# Patient Record
Sex: Female | Born: 1960
Health system: Southern US, Community
[De-identification: ages and names within clinical notes are randomized; demographics above are authoritative.]

## PROBLEM LIST (undated history)

## (undated) DIAGNOSIS — J45909 Unspecified asthma, uncomplicated: Secondary | ICD-10-CM

## (undated) DIAGNOSIS — T7840XA Allergy, unspecified, initial encounter: Secondary | ICD-10-CM

## (undated) DIAGNOSIS — E559 Vitamin D deficiency, unspecified: Secondary | ICD-10-CM

## (undated) DIAGNOSIS — K5790 Diverticulosis of intestine, part unspecified, without perforation or abscess without bleeding: Secondary | ICD-10-CM

## (undated) DIAGNOSIS — K219 Gastro-esophageal reflux disease without esophagitis: Secondary | ICD-10-CM

## (undated) DIAGNOSIS — F419 Anxiety disorder, unspecified: Secondary | ICD-10-CM

## (undated) DIAGNOSIS — E785 Hyperlipidemia, unspecified: Secondary | ICD-10-CM

## (undated) DIAGNOSIS — E119 Type 2 diabetes mellitus without complications: Secondary | ICD-10-CM

## (undated) HISTORY — DX: Anxiety disorder, unspecified: F41.9

## (undated) HISTORY — DX: Type 2 diabetes mellitus without complications: E11.9

## (undated) HISTORY — DX: Unspecified asthma, uncomplicated: J45.909

## (undated) HISTORY — DX: Diverticulosis of intestine, part unspecified, without perforation or abscess without bleeding: K57.90

## (undated) HISTORY — DX: Gastro-esophageal reflux disease without esophagitis: K21.9

## (undated) HISTORY — DX: Hyperlipidemia, unspecified: E78.5

## (undated) HISTORY — DX: Allergy, unspecified, initial encounter: T78.40XA

## (undated) HISTORY — DX: Vitamin D deficiency, unspecified: E55.9

---

## 2001-05-27 ENCOUNTER — Encounter: Payer: Self-pay | Admitting: Family Medicine

## 2001-05-27 ENCOUNTER — Encounter: Admission: RE | Admit: 2001-05-27 | Discharge: 2001-05-27 | Payer: Self-pay | Admitting: Family Medicine

## 2001-09-17 ENCOUNTER — Encounter: Payer: Self-pay | Admitting: Internal Medicine

## 2001-09-17 ENCOUNTER — Encounter: Payer: Self-pay | Admitting: *Deleted

## 2001-09-17 ENCOUNTER — Inpatient Hospital Stay (HOSPITAL_COMMUNITY): Admission: EM | Admit: 2001-09-17 | Discharge: 2001-09-18 | Payer: Self-pay | Admitting: Emergency Medicine

## 2001-09-18 ENCOUNTER — Encounter: Payer: Self-pay | Admitting: Cardiology

## 2002-06-25 ENCOUNTER — Encounter: Payer: Self-pay | Admitting: Family Medicine

## 2002-06-25 ENCOUNTER — Encounter: Admission: RE | Admit: 2002-06-25 | Discharge: 2002-06-25 | Payer: Self-pay | Admitting: Family Medicine

## 2003-08-24 ENCOUNTER — Emergency Department (HOSPITAL_COMMUNITY): Admission: EM | Admit: 2003-08-24 | Discharge: 2003-08-24 | Payer: Self-pay | Admitting: Family Medicine

## 2003-08-31 ENCOUNTER — Ambulatory Visit (HOSPITAL_COMMUNITY): Admission: RE | Admit: 2003-08-31 | Discharge: 2003-08-31 | Payer: Self-pay | Admitting: Family Medicine

## 2004-09-06 ENCOUNTER — Ambulatory Visit (HOSPITAL_COMMUNITY): Admission: RE | Admit: 2004-09-06 | Discharge: 2004-09-06 | Payer: Self-pay | Admitting: Family Medicine

## 2004-09-14 ENCOUNTER — Encounter (INDEPENDENT_AMBULATORY_CARE_PROVIDER_SITE_OTHER): Payer: Self-pay | Admitting: Specialist

## 2004-09-14 ENCOUNTER — Encounter: Admission: RE | Admit: 2004-09-14 | Discharge: 2004-09-14 | Payer: Self-pay | Admitting: Family Medicine

## 2004-11-20 ENCOUNTER — Emergency Department (HOSPITAL_COMMUNITY): Admission: EM | Admit: 2004-11-20 | Discharge: 2004-11-20 | Payer: Self-pay | Admitting: Family Medicine

## 2005-01-18 ENCOUNTER — Encounter: Admission: RE | Admit: 2005-01-18 | Discharge: 2005-01-18 | Payer: Self-pay | Admitting: Family Medicine

## 2005-05-17 ENCOUNTER — Encounter: Admission: RE | Admit: 2005-05-17 | Discharge: 2005-05-17 | Payer: Self-pay | Admitting: Family Medicine

## 2005-06-02 ENCOUNTER — Encounter: Admission: RE | Admit: 2005-06-02 | Discharge: 2005-06-02 | Payer: Self-pay | Admitting: Family Medicine

## 2005-09-15 ENCOUNTER — Ambulatory Visit: Payer: Self-pay | Admitting: Family Medicine

## 2005-09-15 ENCOUNTER — Encounter: Admission: RE | Admit: 2005-09-15 | Discharge: 2005-09-15 | Payer: Self-pay | Admitting: Family Medicine

## 2005-10-02 ENCOUNTER — Encounter: Admission: RE | Admit: 2005-10-02 | Discharge: 2005-10-02 | Payer: Self-pay | Admitting: Family Medicine

## 2006-11-28 ENCOUNTER — Ambulatory Visit (HOSPITAL_COMMUNITY): Admission: RE | Admit: 2006-11-28 | Discharge: 2006-11-28 | Payer: Self-pay | Admitting: Family Medicine

## 2007-03-26 IMAGING — CR DG KNEE 1-2V*R*
2 series · 2 of 2 positions shown · non-contrast
Comparison: none

CLINICAL DATA: 44-year-old with right medial knee pain.  No injury.
 2-VIEW RIGHT KNEE:

[view not recorded (1 of 2)]
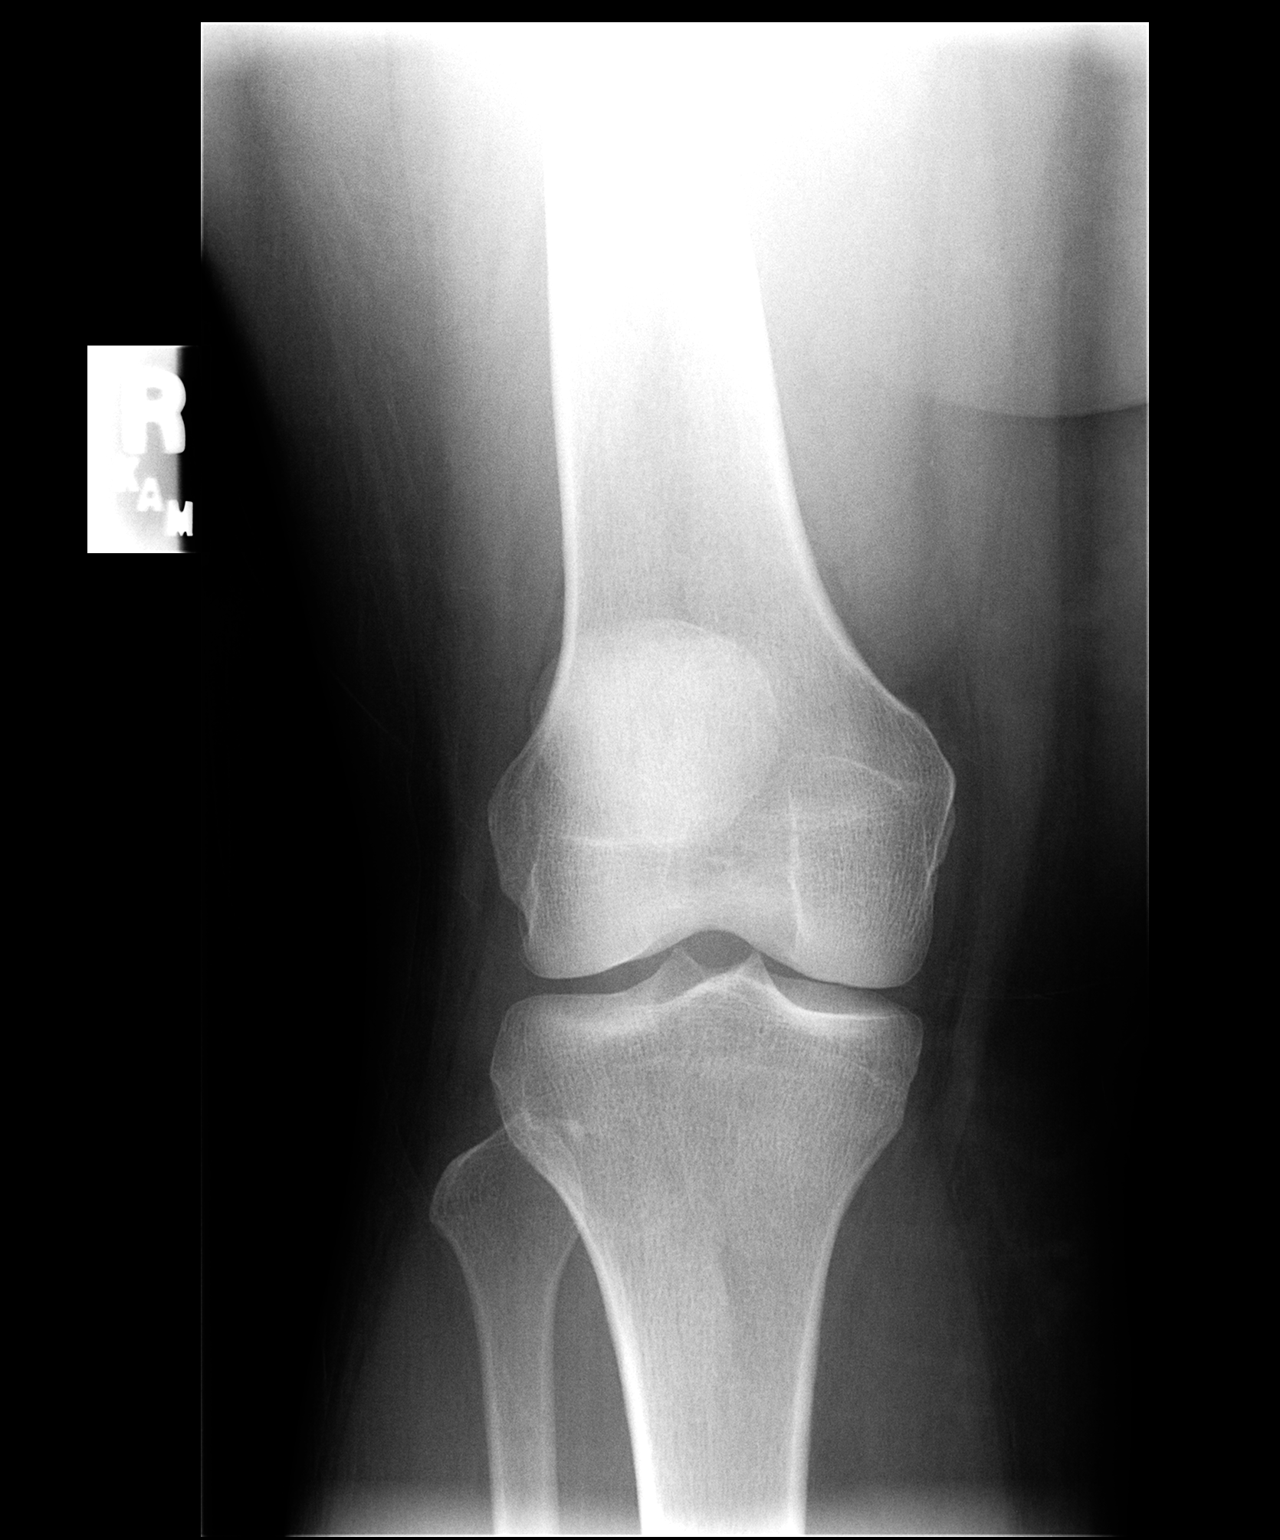

[view not recorded (2 of 2)]
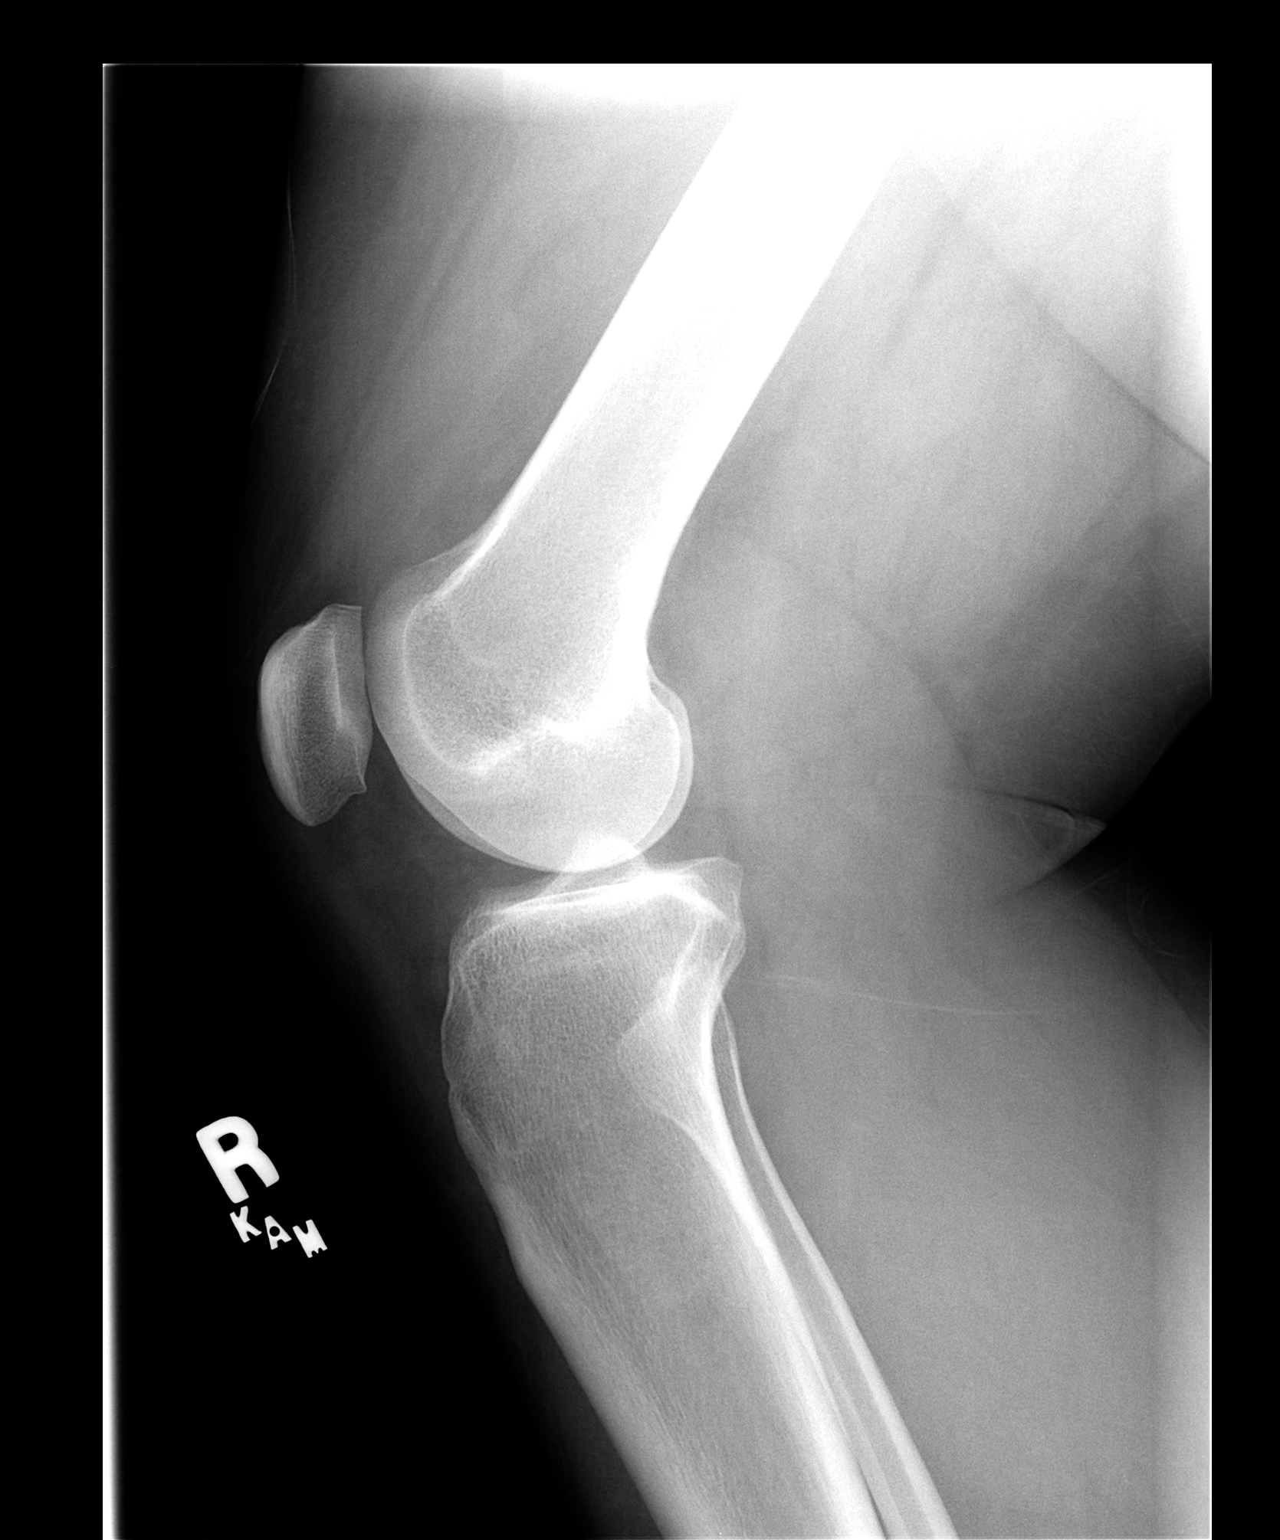

[2 of 2 positions shown; findings below may reference images not displayed]

FINDINGS: Joint spaces are maintained.  No fractures are seen.  No degenerative changes.  There is a small joint effusion.
IMPRESSION: No acute bony findings.  Probable small joint effusion.

## 2007-11-21 IMAGING — CR DG CERVICAL SPINE COMPLETE 4+V
6 series · 6 of 6 positions shown · non-contrast
Comparison: none

CLINICAL DATA: Headaches radiating into the neck.
 CERVICAL SPINE:
 Five views of the cervical spine were obtained.  The cervical vertebrae are slightly straightened in alignment.  There is degenerative disk disease at C5-6 where there is slight loss of disk space.  Only mild foraminal narrowing is noted bilaterally at C5-6.  The odontoid process is intact.

[view not recorded (1 of 6)]
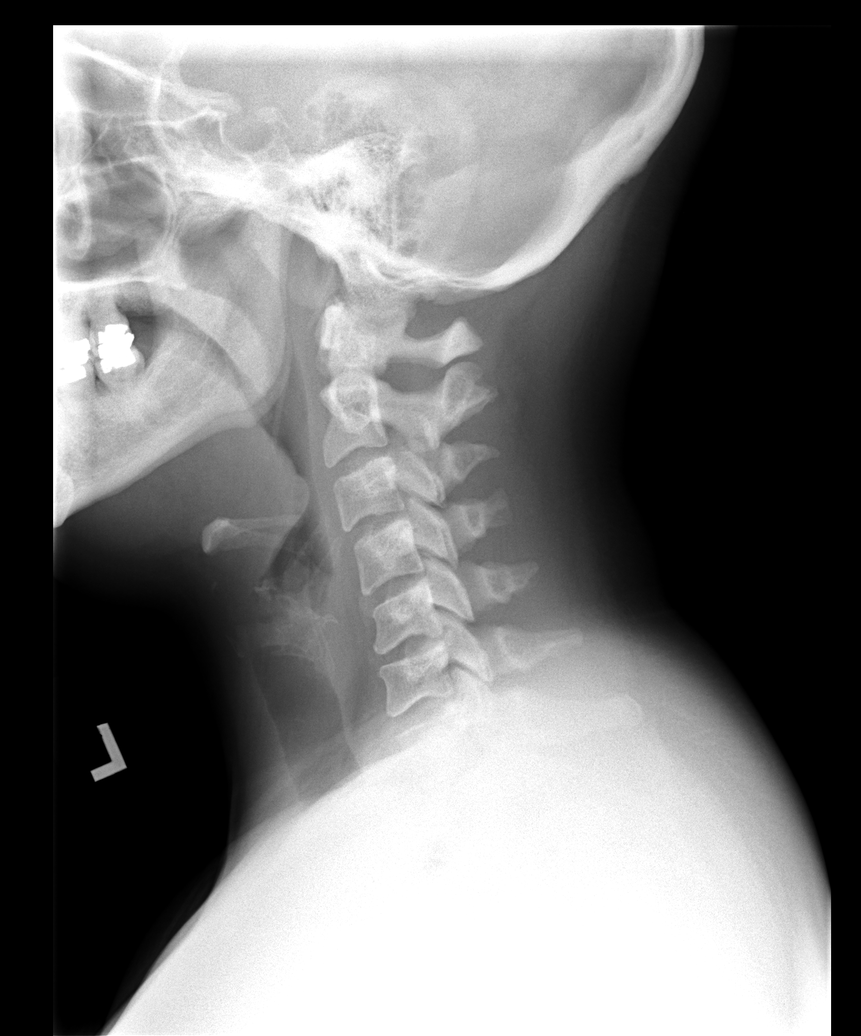

[view not recorded (2 of 6)]
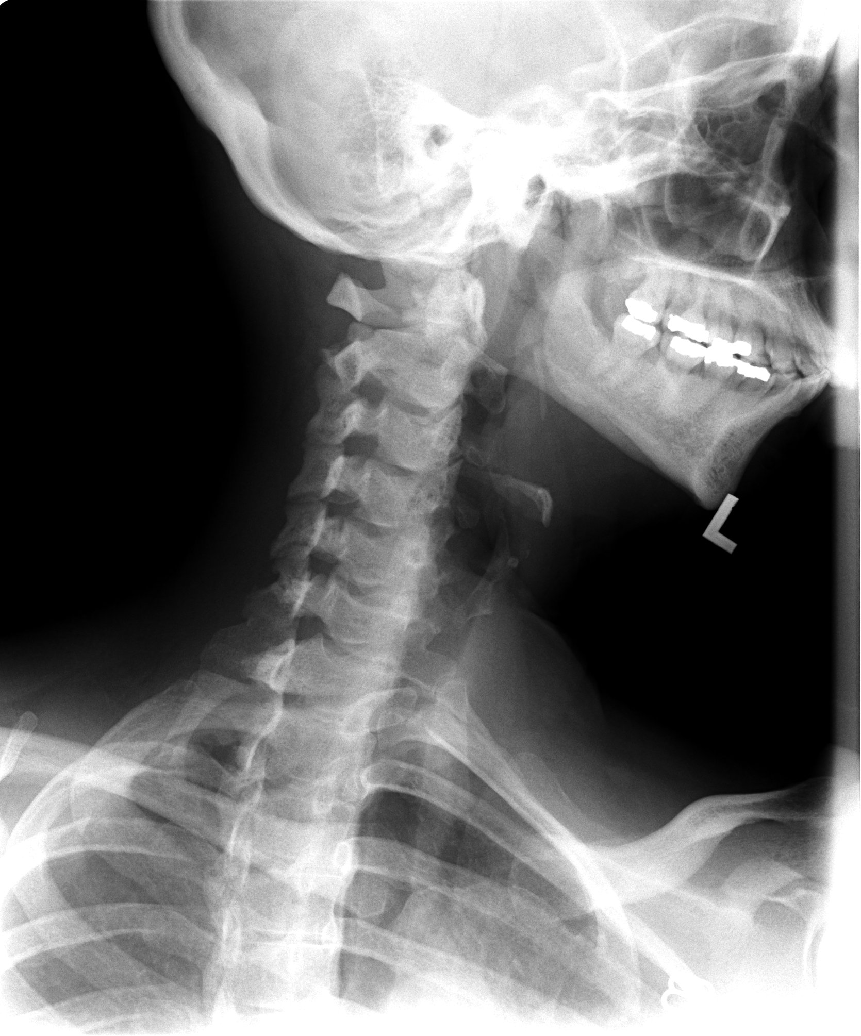

[view not recorded (3 of 6)]
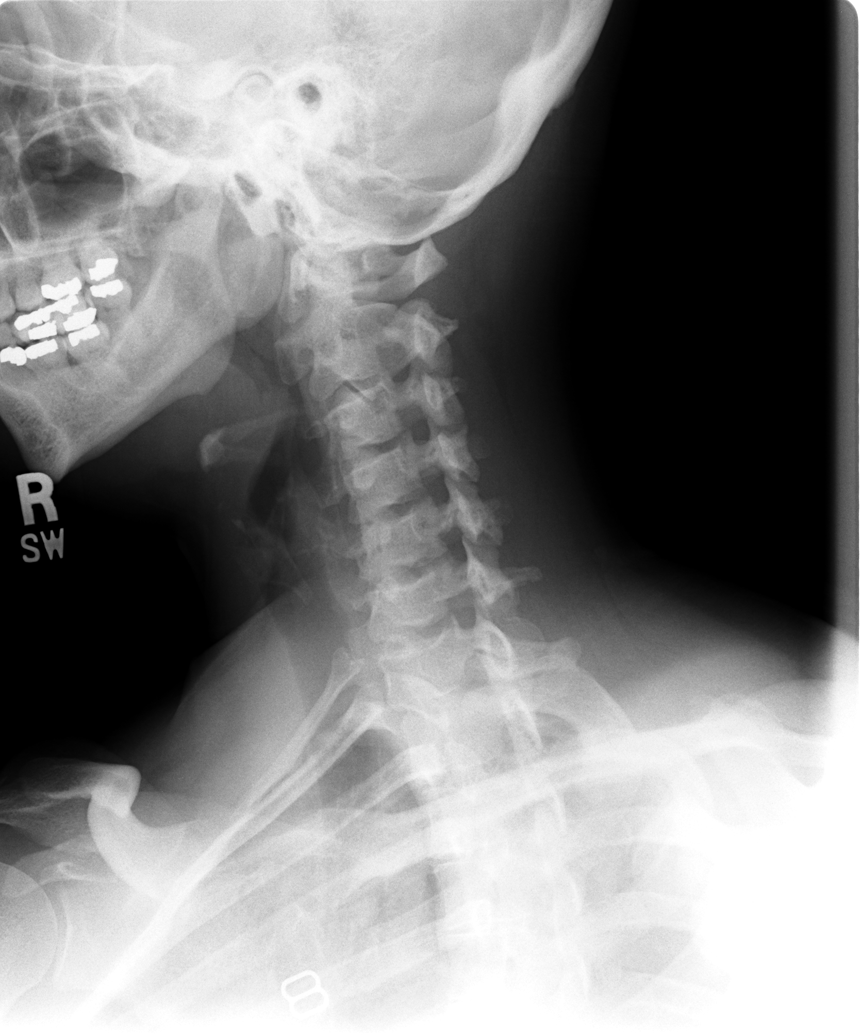

[view not recorded (4 of 6)]
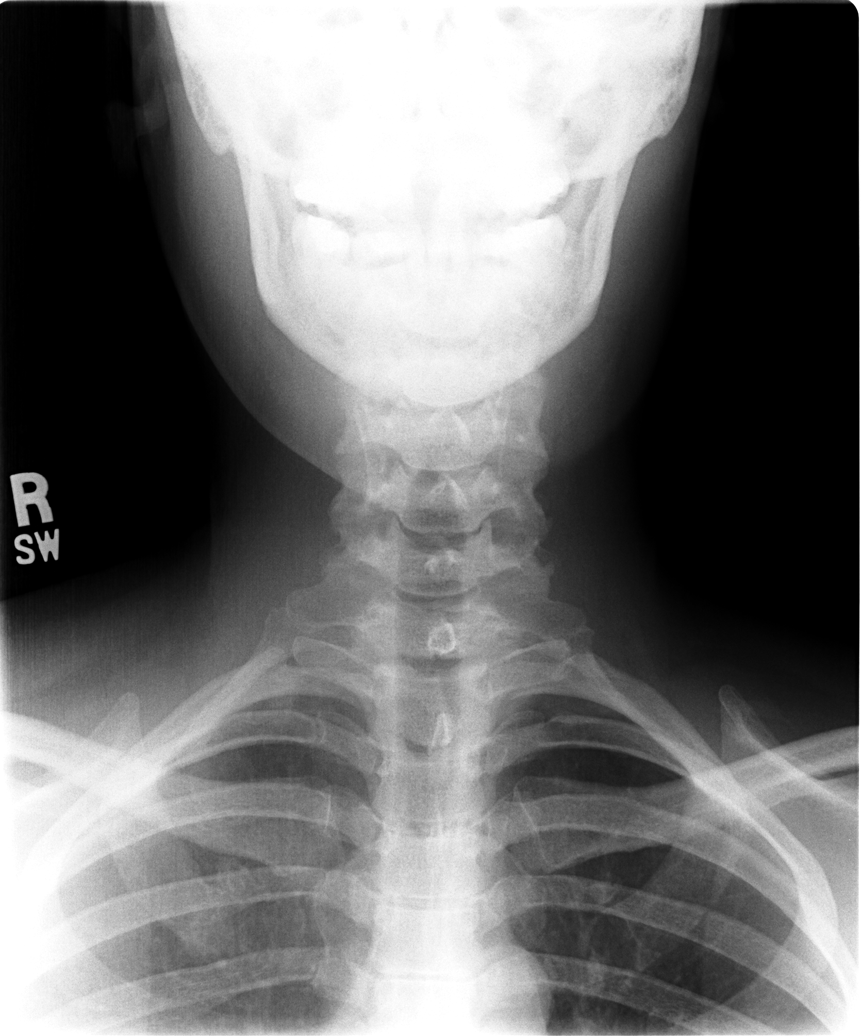

[view not recorded (5 of 6)]
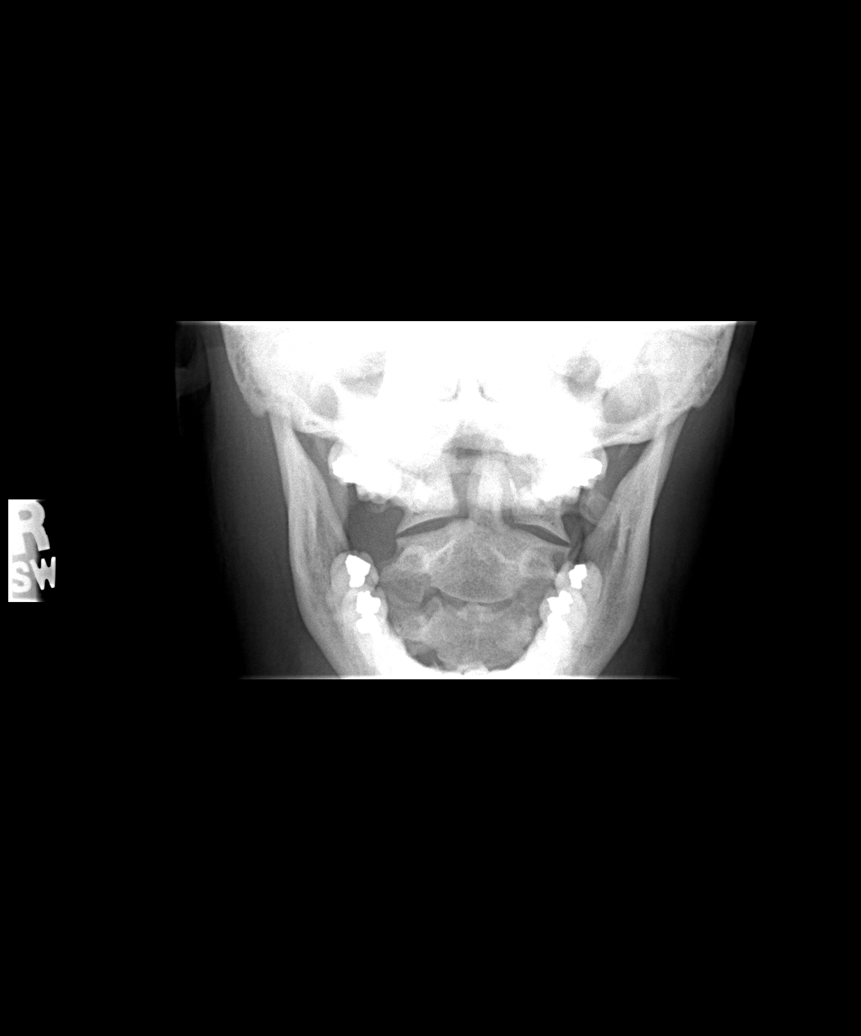

[view not recorded (6 of 6)]
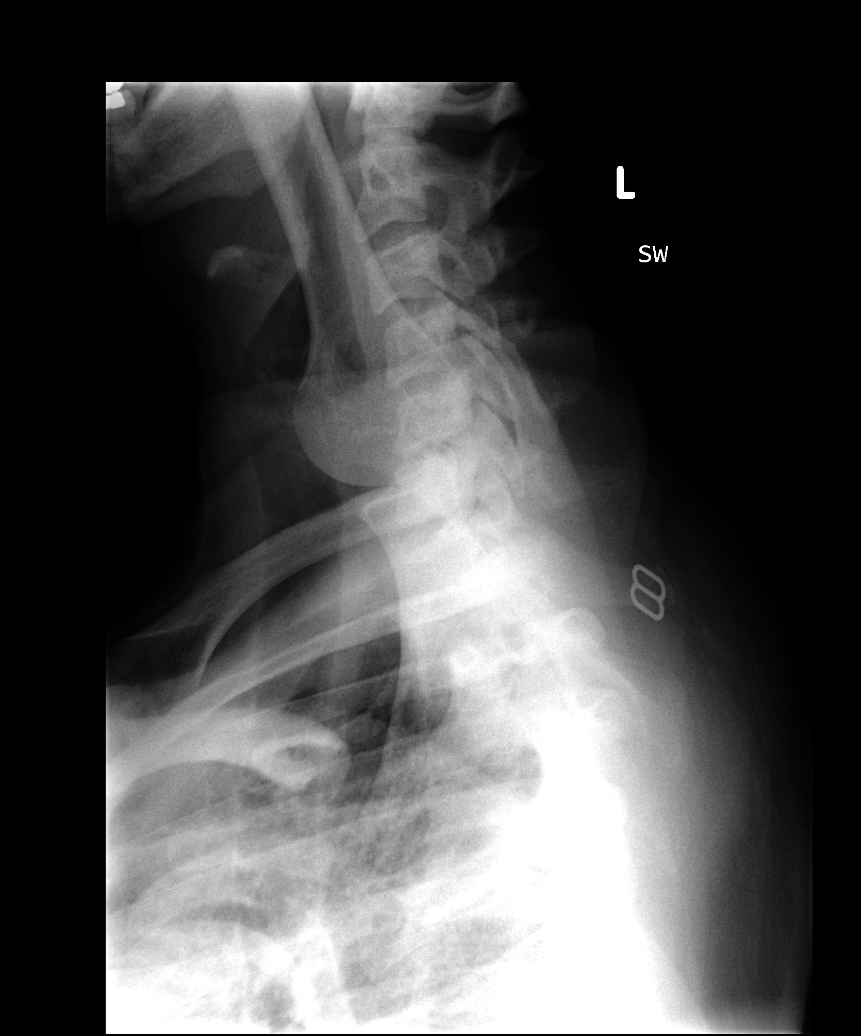

[6 of 6 positions shown; findings below may reference images not displayed]

IMPRESSION: Mild degenerative disk disease at C5-6 with mild foraminal narrowing at that level.  Straightened alignment.

## 2007-12-19 ENCOUNTER — Ambulatory Visit (HOSPITAL_COMMUNITY): Admission: RE | Admit: 2007-12-19 | Discharge: 2007-12-19 | Payer: Self-pay | Admitting: Family Medicine

## 2008-02-12 ENCOUNTER — Encounter: Admission: RE | Admit: 2008-02-12 | Discharge: 2008-02-12 | Payer: Self-pay | Admitting: Family Medicine

## 2009-01-05 ENCOUNTER — Ambulatory Visit (HOSPITAL_COMMUNITY): Admission: RE | Admit: 2009-01-05 | Discharge: 2009-01-05 | Payer: Self-pay | Admitting: Family Medicine

## 2009-05-21 ENCOUNTER — Emergency Department (HOSPITAL_COMMUNITY): Admission: EM | Admit: 2009-05-21 | Discharge: 2009-05-22 | Payer: Self-pay | Admitting: Emergency Medicine

## 2010-01-21 ENCOUNTER — Ambulatory Visit (HOSPITAL_COMMUNITY)
Admission: RE | Admit: 2010-01-21 | Discharge: 2010-01-21 | Payer: Self-pay | Source: Home / Self Care | Admitting: Family Medicine

## 2010-02-10 ENCOUNTER — Encounter
Admission: RE | Admit: 2010-02-10 | Discharge: 2010-02-10 | Payer: Self-pay | Source: Home / Self Care | Attending: Family Medicine | Admitting: Family Medicine

## 2010-03-12 ENCOUNTER — Encounter: Payer: Self-pay | Admitting: Family Medicine

## 2010-03-13 ENCOUNTER — Encounter: Payer: Self-pay | Admitting: Family Medicine

## 2010-05-11 LAB — POCT I-STAT, CHEM 8
BUN: 12 mg/dL (ref 6–23)
Calcium, Ion: 1.2 mmol/L (ref 1.12–1.32)
HCT: 38 % (ref 36.0–46.0)
Sodium: 140 mEq/L (ref 135–145)
TCO2: 29 mmol/L (ref 0–100)

## 2010-05-11 LAB — URINALYSIS, ROUTINE W REFLEX MICROSCOPIC
Glucose, UA: NEGATIVE mg/dL
Ketones, ur: 15 mg/dL — AB
Protein, ur: 30 mg/dL — AB
pH: 5.5 (ref 5.0–8.0)

## 2010-05-11 LAB — LIPASE, BLOOD: Lipase: 20 U/L (ref 11–59)

## 2010-05-11 LAB — HEPATIC FUNCTION PANEL
Albumin: 3.8 g/dL (ref 3.5–5.2)
Alkaline Phosphatase: 61 U/L (ref 39–117)
Total Bilirubin: 0.8 mg/dL (ref 0.3–1.2)
Total Protein: 7.4 g/dL (ref 6.0–8.3)

## 2010-05-11 LAB — URINE MICROSCOPIC-ADD ON

## 2010-07-08 NOTE — Discharge Summary (Signed)
Deborah Fuller, Deborah Fuller                     ACCOUNT NO.:  [id number]   MEDICAL RECORD NO.:  [id number]                   PATIENT TYPE:  INP   LOCATION:  2036                                 FACILITY:  [facility]   PHYSICIAN:  Beth A. Sheppard Penton, M.D.                  DATE OF BIRTH:  February 27, 1960   DATE OF ADMISSION:  09/17/2001  DATE OF DISCHARGE:  09/18/2001                                 DISCHARGE SUMMARY   DISCHARGE DIAGNOSES:  1. Atypical chest pain.     a. Suspect costochondritis versus acid reflux.     b. CT scan negative for deep venous thrombosis or pulmonary embolus.     c. Cardiolite:  No ischemia, normal ejection fraction.  2. Exertional dyspnea.     a. Likely secondary deconditioning.  3. History of palpitations.     a. Consider Holter monitor or event monitor if persistent.  4. Mild normocytic anemia.     a. Discharge hemoglobin 11.6, MCV 89.     b. Outpatient workup if needed.  5. Status post cesarean section in 1994.  6. No known drug allergies.   DISCHARGE MEDICATIONS:  1. (New) Protonix 40 mg q.d. x1 month.  2. (New) Vioxx 25 mg q.d. for five days.  3. Tylenol Extra-Strength one to two q.6h. p.r.n. breakthrough pain.   CONDITION UPON DISCHARGE:  Stable.  The patient is pain-free.   DISPOSITION:  Home.   RECOMMENDED ACTIVITY:  As tolerated.   RECOMMENDED DIET:  As tolerated.   SPECIAL INSTRUCTIONS:  Call or return if you have problems.   FOLLOWUP:  The patient is to contact Dr. Caryn Bee C. [place] to schedule  followup in one to two weeks.  At that time, he will reassess her atypical  chest symptoms and also consider guaiac cards for her anemia.   CONSULTANTS:  Francisca December, M.D.   PROCEDURES:  1. Spiral chest CT with lower extremity cuts.  Preliminary results:  No     evidence of deep venous thrombosis or pulmonary embolus.  2. Electrocardiogram:  Sinus bradycardia at 56, otherwise, unremarkable.  3. Exercise Cardiolite:  Ejection fraction normal.  No  evidence of ischemia     or infarct.   HOSPITAL COURSE:  1. ATYPICAL CHEST PAIN:  The patient is a 50 year old African American     nonsmoker who presents with a three-day history of anterior chest pain     and exertional dyspnea.  She describes it as a pressure at times and     occasionally as a sharp type of pleuritic pain; she also noted     palpitations at times.  She said she was seen as an outpatient recently     and diagnosed with shingles.  On presentation, she has a normal EKG     except for sinus bradycardia at a rate of 56.  Initial cardiac enzymes     were negative.  She does have  some palpable tenderness along the left     costochondral junction.  As I stated above, spiral CT was negative for PE     and DVT and exercise Cardiolite was also normal.  I suspect that this is     either a costochondritis versus a mild form of pleurisy versus     gastroesophageal reflux disease.  I would treat her with Protonix for a     month and try Vioxx for five days.  She will follow up with Dr. Sydnee Levans.     Concerning the palpitations, if they are persistent, I would recommend an     outpatient event or Holter monitor; this can be discussed as an     outpatient.   1. NORMOCYTIC ANEMIA:  Hemoglobin 11.6 with an MCV of 89.  Recommend guaiac     cards and outpatient followup per Dr. Sydnee Levans.   DISCHARGE AND OTHER PERTINENT LABORATORY DATA:  Hemoglobin 11.6, WBC 4500,  platelet count 264,000.  Sodium 137, potassium 3.6, chloride 109, bicarb 24,  BUN 10, creatinine 0.8, glucose 102.  LFTs normal.                                                Beth A. Sheppard Penton, M.D.    BAW/MEDQ  D:  09/18/2001  T:  09/24/2001  Job:  [id number]   cc:   Lilyan Punt. Sydnee Levans, M.D.   Francisca December, M.D.

## 2010-07-08 NOTE — Assessment & Plan Note (Signed)
[facility]                          [place] OFFICE NOTE   IMAJEAN, MCDERMID                     MRN:          [id number]  DATE:09/15/2005                            DOB:          17-Jan-1961    REASON FOR VISIT:  Headaches.   Ms. Pinho is a 51 year old female who complains of headaches off and on  for several months.  She describes it as a pressure tightness around her  scalp.  Sometimes it radiates down her neck.  She has been taking over-the-  counter Aleve 3-4 times a week to help with the headaches.  She denies any  photophobia, sound sensitivity, nausea or vomiting.  Headaches do not wake  her up from sleep.  She denies any other neurologic symptoms.  The headaches  are not worsening, but they are not improving.  She has noticed correlation  with increased stress.   PAST MEDICAL HISTORY:  None.   MEDICATIONS:  None.   ALLERGIES:  No known drug allergies.   FATHER HISTORY:  Father alive with a history of diabetes.  Mother is also  alive with a history of diabetes.  She has one brother who is doing well.   SOCIAL HISTORY:  She is a Architectural technologist.  Married with three children.  Drinks alcohol occasionally.   REVIEW OF SYSTEMS:  As per HPI, otherwise unremarkable.   OBJECTIVE:  VITAL SIGNS:  Blood pressure 120/86, pulse 76.  Weight 208  pounds.  GENERAL:  We have a pleasant female in no acute distress.  Answers questions  appropriately.  Alert and oriented x3.  HEENT:  Normocephalic and atraumatic.  Pupils are equal and reactive to  light.  Extraocular movements wee intact.  No nystagmus appreciated.  NECK:  Supple.  No lymphadenopathy, carotid bruits, or JVD.  LUNGS:  Clear.  HEART:  Regular rate and rhythm.  Normal S1 and S2.  No murmurs, rubs or  gallops.  EXTREMITIES:  No clubbing, cyanosis or edema.  NEUROLOGIC:  No focal sensory or motor deficits were noted.  Deep tendon  reflexes were 2+ and equal  bilaterally.  Cranial nerves II-XII were grossly  intact.  Cerebellar function was within normal limits.  No pronator drift  was appreciated.   IMPRESSION:  A 50 year old female presenting with headaches consisting of  tension-type headaches.   PLAN:  1.  Recommended a headache diary.  2.  Will obtain a C-spine x-ray since patient reported later on in the visit      that when she pops her neck, the headaches improve.  3.  Will start the patient on Elavil 25 mg nightly.  Side effects were      reviewed.  4.  Patient will follow up with me in four weeks or sooner if needed.                                   Leanne Chang, MD   LA/MedQ  DD:  09/15/2005  DT:  09/15/2005  Job #:  [id number]

## 2011-01-06 ENCOUNTER — Other Ambulatory Visit (HOSPITAL_COMMUNITY): Payer: Self-pay | Admitting: Family Medicine

## 2011-01-06 DIAGNOSIS — Z1231 Encounter for screening mammogram for malignant neoplasm of breast: Secondary | ICD-10-CM

## 2011-02-09 ENCOUNTER — Ambulatory Visit (HOSPITAL_COMMUNITY)
Admission: RE | Admit: 2011-02-09 | Discharge: 2011-02-09 | Disposition: A | Discharge status: Home / Self Care | Payer: BC Managed Care – PPO | Source: Ambulatory Visit | Attending: Family Medicine | Admitting: Family Medicine

## 2011-02-09 DIAGNOSIS — Z1231 Encounter for screening mammogram for malignant neoplasm of breast: Secondary | ICD-10-CM | POA: Insufficient documentation

## 2012-01-29 ENCOUNTER — Other Ambulatory Visit (HOSPITAL_COMMUNITY): Payer: Self-pay | Admitting: Family Medicine

## 2012-01-29 DIAGNOSIS — Z1231 Encounter for screening mammogram for malignant neoplasm of breast: Secondary | ICD-10-CM

## 2012-02-16 ENCOUNTER — Ambulatory Visit (HOSPITAL_COMMUNITY)
Admission: RE | Admit: 2012-02-16 | Discharge: 2012-02-16 | Disposition: A | Discharge status: Home / Self Care | Payer: BC Managed Care – PPO | Source: Ambulatory Visit | Attending: Family Medicine | Admitting: Family Medicine

## 2012-02-16 DIAGNOSIS — Z1231 Encounter for screening mammogram for malignant neoplasm of breast: Secondary | ICD-10-CM | POA: Insufficient documentation

## 2013-02-04 ENCOUNTER — Other Ambulatory Visit (HOSPITAL_COMMUNITY): Payer: Self-pay | Admitting: Emergency Medicine

## 2013-02-04 DIAGNOSIS — Z1231 Encounter for screening mammogram for malignant neoplasm of breast: Secondary | ICD-10-CM

## 2013-02-27 ENCOUNTER — Ambulatory Visit (HOSPITAL_COMMUNITY)
Admission: RE | Admit: 2013-02-27 | Discharge: 2013-02-27 | Disposition: A | Discharge status: Home / Self Care | Payer: BC Managed Care – PPO | Source: Ambulatory Visit | Attending: Emergency Medicine | Admitting: Emergency Medicine

## 2013-02-27 DIAGNOSIS — Z1231 Encounter for screening mammogram for malignant neoplasm of breast: Secondary | ICD-10-CM | POA: Insufficient documentation

## 2013-03-11 LAB — HM PAP SMEAR: HM Pap smear: NEGATIVE

## 2014-02-03 ENCOUNTER — Other Ambulatory Visit (HOSPITAL_COMMUNITY): Payer: Self-pay | Admitting: Emergency Medicine

## 2014-02-03 DIAGNOSIS — Z1231 Encounter for screening mammogram for malignant neoplasm of breast: Secondary | ICD-10-CM

## 2014-03-05 ENCOUNTER — Ambulatory Visit (HOSPITAL_COMMUNITY)
Admission: RE | Admit: 2014-03-05 | Discharge: 2014-03-05 | Disposition: A | Discharge status: Home / Self Care | Payer: BC Managed Care – PPO | Source: Ambulatory Visit | Attending: Emergency Medicine | Admitting: Emergency Medicine

## 2014-03-05 DIAGNOSIS — Z1231 Encounter for screening mammogram for malignant neoplasm of breast: Secondary | ICD-10-CM | POA: Diagnosis not present

## 2014-03-09 LAB — VITAMIN D 25 HYDROXY (VIT D DEFICIENCY, FRACTURES): Vit D, 25-Hydroxy: 9.8

## 2014-04-02 LAB — HM COLONOSCOPY

## 2015-02-10 ENCOUNTER — Other Ambulatory Visit: Payer: Self-pay

## 2015-02-10 DIAGNOSIS — Z1231 Encounter for screening mammogram for malignant neoplasm of breast: Secondary | ICD-10-CM

## 2015-03-08 LAB — VITAMIN D 25 HYDROXY (VIT D DEFICIENCY, FRACTURES): Vit D, 25-Hydroxy: 17

## 2015-03-09 ENCOUNTER — Ambulatory Visit
Admission: RE | Admit: 2015-03-09 | Discharge: 2015-03-09 | Disposition: A | Discharge status: Home / Self Care | Payer: BC Managed Care – PPO | Source: Ambulatory Visit

## 2015-03-09 DIAGNOSIS — Z1231 Encounter for screening mammogram for malignant neoplasm of breast: Secondary | ICD-10-CM

## 2016-01-19 ENCOUNTER — Encounter: Payer: Self-pay | Admitting: *Deleted

## 2016-01-20 ENCOUNTER — Institutional Professional Consult (permissible substitution): Payer: BC Managed Care – PPO | Admitting: Pulmonary Disease

## 2016-02-28 ENCOUNTER — Other Ambulatory Visit: Payer: Self-pay | Admitting: Physician Assistant

## 2016-02-28 DIAGNOSIS — Z1231 Encounter for screening mammogram for malignant neoplasm of breast: Secondary | ICD-10-CM

## 2016-03-07 DIAGNOSIS — E119 Type 2 diabetes mellitus without complications: Secondary | ICD-10-CM | POA: Insufficient documentation

## 2016-03-07 DIAGNOSIS — J45909 Unspecified asthma, uncomplicated: Secondary | ICD-10-CM | POA: Insufficient documentation

## 2016-03-20 ENCOUNTER — Ambulatory Visit: Payer: BC Managed Care – PPO

## 2016-03-31 ENCOUNTER — Ambulatory Visit
Admission: RE | Admit: 2016-03-31 | Discharge: 2016-03-31 | Disposition: A | Discharge status: Home / Self Care | Payer: BC Managed Care – PPO | Source: Ambulatory Visit | Attending: Physician Assistant | Admitting: Physician Assistant

## 2016-03-31 DIAGNOSIS — Z1231 Encounter for screening mammogram for malignant neoplasm of breast: Secondary | ICD-10-CM

## 2017-03-02 ENCOUNTER — Other Ambulatory Visit: Payer: Self-pay | Admitting: Physician Assistant

## 2017-03-02 DIAGNOSIS — Z1231 Encounter for screening mammogram for malignant neoplasm of breast: Secondary | ICD-10-CM

## 2017-04-02 ENCOUNTER — Ambulatory Visit
Admission: RE | Admit: 2017-04-02 | Discharge: 2017-04-02 | Disposition: A | Discharge status: Home / Self Care | Payer: BC Managed Care – PPO | Source: Ambulatory Visit | Attending: Physician Assistant | Admitting: Physician Assistant

## 2017-04-02 ENCOUNTER — Other Ambulatory Visit: Payer: Self-pay | Admitting: Physician Assistant

## 2017-04-02 DIAGNOSIS — Z1231 Encounter for screening mammogram for malignant neoplasm of breast: Secondary | ICD-10-CM

## 2017-04-25 LAB — COLOGUARD: Cologuard: NEGATIVE

## 2018-03-29 ENCOUNTER — Other Ambulatory Visit: Payer: Self-pay | Admitting: Physician Assistant

## 2018-03-29 DIAGNOSIS — Z1231 Encounter for screening mammogram for malignant neoplasm of breast: Secondary | ICD-10-CM

## 2018-04-30 ENCOUNTER — Ambulatory Visit: Payer: BC Managed Care – PPO

## 2018-05-29 ENCOUNTER — Ambulatory Visit: Payer: BC Managed Care – PPO

## 2018-08-05 ENCOUNTER — Ambulatory Visit
Admission: RE | Admit: 2018-08-05 | Discharge: 2018-08-05 | Disposition: A | Discharge status: Home / Self Care | Payer: BC Managed Care – PPO | Source: Ambulatory Visit | Attending: Physician Assistant | Admitting: Physician Assistant

## 2018-08-05 ENCOUNTER — Other Ambulatory Visit: Payer: Self-pay

## 2018-08-05 DIAGNOSIS — Z1231 Encounter for screening mammogram for malignant neoplasm of breast: Secondary | ICD-10-CM

## 2019-03-08 LAB — COMPREHENSIVE METABOLIC PANEL
Albumin: 4.1 (ref 3.5–5.0)
Calcium: 9.4 (ref 8.7–10.7)
GFR calc Af Amer: 77
GFR calc non Af Amer: 66
Globulin: 3

## 2019-03-08 LAB — CBC: RBC: 4.33 (ref 3.87–5.11)

## 2019-03-08 LAB — CBC AND DIFFERENTIAL
HCT: 38 (ref 36–46)
Hemoglobin: 12.7 (ref 12.0–16.0)
Neutrophils Absolute: 2180
Platelets: 269 (ref 150–399)
WBC: 4.6

## 2019-03-08 LAB — BASIC METABOLIC PANEL
BUN: 10 (ref 4–21)
CO2: 27 — AB (ref 13–22)
Chloride: 105 (ref 99–108)
Creatinine: 0.9 (ref 0.5–1.1)
Glucose: 88
Potassium: 4.5 (ref 3.4–5.3)
Sodium: 140 (ref 137–147)

## 2019-03-08 LAB — HEPATIC FUNCTION PANEL
ALT: 29 (ref 7–35)
AST: 32 (ref 13–35)
Alkaline Phosphatase: 56 (ref 25–125)
Bilirubin, Total: 0.7

## 2019-03-08 LAB — HEMOGLOBIN A1C: Hemoglobin A1C: 6.1

## 2019-03-08 LAB — VITAMIN D 25 HYDROXY (VIT D DEFICIENCY, FRACTURES): Vit D, 25-Hydroxy: 24

## 2019-03-08 LAB — LIPID PANEL
Cholesterol: 210 — AB (ref 0–200)
HDL: 71 — AB (ref 35–70)
LDL Cholesterol: 123
Triglycerides: 65 (ref 40–160)

## 2019-03-08 LAB — TSH: TSH: 1.36 (ref 0.41–5.90)

## 2019-07-02 ENCOUNTER — Other Ambulatory Visit: Payer: Self-pay | Admitting: Physician Assistant

## 2019-07-02 DIAGNOSIS — Z1231 Encounter for screening mammogram for malignant neoplasm of breast: Secondary | ICD-10-CM

## 2019-08-08 ENCOUNTER — Other Ambulatory Visit: Payer: Self-pay

## 2019-08-08 ENCOUNTER — Ambulatory Visit
Admission: RE | Admit: 2019-08-08 | Discharge: 2019-08-08 | Disposition: A | Discharge status: Home / Self Care | Payer: BC Managed Care – PPO | Source: Ambulatory Visit | Attending: Physician Assistant | Admitting: Physician Assistant

## 2019-08-08 ENCOUNTER — Ambulatory Visit: Payer: BC Managed Care – PPO

## 2019-08-08 DIAGNOSIS — Z1231 Encounter for screening mammogram for malignant neoplasm of breast: Secondary | ICD-10-CM

## 2019-12-02 ENCOUNTER — Other Ambulatory Visit (HOSPITAL_BASED_OUTPATIENT_CLINIC_OR_DEPARTMENT_OTHER): Payer: Self-pay | Admitting: Internal Medicine

## 2019-12-02 ENCOUNTER — Ambulatory Visit: Payer: BC Managed Care – PPO | Attending: Internal Medicine

## 2019-12-02 DIAGNOSIS — Z23 Encounter for immunization: Secondary | ICD-10-CM

## 2019-12-02 NOTE — Progress Notes (Signed)
   Covid-19 Vaccination Clinic  Name:  Deborah Fuller    MRN: [id number] DOB: 1960/11/21  12/02/2019  [name] was observed post Covid-19 immunization for 15 minutes without incident. She was provided with Vaccine Information Sheet and instruction to access the V-Safe system. Vaccinated by Evert Kohl.  [name] was instructed to call 911 with any severe reactions post vaccine: Marland Kitchen Difficulty breathing  . Swelling of face and throat  . A fast heartbeat  . A bad rash all over body  . Dizziness and weakness   Immunizations Administered    Name Date Dose VIS Date Route   Pfizer COVID-19 Vaccine 12/02/2019 12:37 PM 0.3 mL 04/16/2018 Intramuscular   Manufacturer: [employer], [employer]   Lot: [id number]   NDC: [address]-6

## 2019-12-22 ENCOUNTER — Other Ambulatory Visit: Payer: Self-pay

## 2019-12-22 ENCOUNTER — Other Ambulatory Visit: Payer: BC Managed Care – PPO

## 2019-12-22 DIAGNOSIS — Z20822 Contact with and (suspected) exposure to covid-19: Secondary | ICD-10-CM

## 2019-12-23 ENCOUNTER — Ambulatory Visit: Payer: BC Managed Care – PPO | Attending: Internal Medicine

## 2019-12-23 ENCOUNTER — Other Ambulatory Visit (HOSPITAL_BASED_OUTPATIENT_CLINIC_OR_DEPARTMENT_OTHER): Payer: Self-pay | Admitting: Internal Medicine

## 2019-12-23 DIAGNOSIS — Z23 Encounter for immunization: Secondary | ICD-10-CM

## 2019-12-23 LAB — SARS-COV-2, NAA 2 DAY TAT

## 2019-12-23 LAB — NOVEL CORONAVIRUS, NAA: SARS-CoV-2, NAA: NOT DETECTED

## 2019-12-23 NOTE — Progress Notes (Signed)
   Covid-19 Vaccination Clinic  Name:  Deborah Fuller    MRN: [id number] DOB: September 27, 1960  12/23/2019  Deborah Fuller was observed post Covid-19 immunization for 15 minutes without incident. She was provided with Vaccine Information Sheet and instruction to access the V-Safe system. Vaccinated by [employer] student Deedra Ehrich.  Deborah Fuller was instructed to call 911 with any severe reactions post vaccine: Marland Kitchen Difficulty breathing  . Swelling of face and throat  . A fast heartbeat  . A bad rash all over body  . Dizziness and weakness   Immunizations Administered    Name Date Dose VIS Date Route   Pfizer COVID-19 Vaccine 12/23/2019 12:29 PM 0.3 mL 12/10/2019 Intramuscular   Manufacturer: [employer], [employer]   Lot: [id number]   NDC: [address]-9

## 2020-01-01 MED FILL — PFIZER-BIONTECH COVID-19 VA: 30 | 1 days supply | Qty: 0 | Fill #0

## 2020-03-11 LAB — BASIC METABOLIC PANEL
BUN: 9 (ref 4–21)
CO2: 28 — AB (ref 13–22)
Chloride: 105 (ref 99–108)
Creatinine: 0.9 (ref 0.5–1.1)
Glucose: 90
Potassium: 4.4 (ref 3.4–5.3)
Sodium: 142 (ref 137–147)

## 2020-03-11 LAB — CBC: RBC: 4.29 (ref 3.87–5.11)

## 2020-03-11 LAB — COMPREHENSIVE METABOLIC PANEL
Albumin: 4.2 (ref 3.5–5.0)
Calcium: 9.4 (ref 8.7–10.7)
GFR calc Af Amer: 79
GFR calc non Af Amer: 69
Globulin: 2.9

## 2020-03-11 LAB — CBC AND DIFFERENTIAL
HCT: 38 (ref 36–46)
Hemoglobin: 12.9 (ref 12.0–16.0)
Neutrophils Absolute: 1494
Platelets: 249 (ref 150–399)
WBC: 3.9

## 2020-03-11 LAB — HEPATIC FUNCTION PANEL
ALT: 27 (ref 7–35)
AST: 36 — AB (ref 13–35)
Alkaline Phosphatase: 55 (ref 25–125)
Bilirubin, Total: 0.5

## 2020-03-11 LAB — LIPID PANEL
Cholesterol: 222 — AB (ref 0–200)
HDL: 75 — AB (ref 35–70)
LDL Cholesterol: 131
Triglycerides: 65 (ref 40–160)

## 2020-03-11 LAB — TSH: TSH: 3.48 (ref 0.41–5.90)

## 2020-03-11 LAB — HEMOGLOBIN A1C: Hemoglobin A1C: 6.2

## 2020-04-05 LAB — COLOGUARD: Cologuard: NEGATIVE

## 2020-04-09 LAB — COLOGUARD: COLOGUARD: NEGATIVE

## 2020-06-08 LAB — HEPATIC FUNCTION PANEL
ALT: 19 (ref 7–35)
AST: 23 (ref 13–35)
Alkaline Phosphatase: 60 (ref 25–125)
Bilirubin, Total: 0.3

## 2020-06-08 LAB — BASIC METABOLIC PANEL
BUN: 16 (ref 4–21)
CO2: 28 — AB (ref 13–22)
Chloride: 107 (ref 99–108)
Creatinine: 0.8 (ref 0.5–1.1)
Glucose: 97
Potassium: 4.7 (ref 3.4–5.3)
Sodium: 142 (ref 137–147)

## 2020-06-08 LAB — COMPREHENSIVE METABOLIC PANEL
Albumin: 4.1 (ref 3.5–5.0)
Calcium: 9.6 (ref 8.7–10.7)
GFR calc Af Amer: 88
GFR calc non Af Amer: 76
Globulin: 2.9

## 2020-06-08 LAB — LIPID PANEL
Cholesterol: 159 (ref 0–200)
HDL: 72 — AB (ref 35–70)
LDL Cholesterol: 71
Triglycerides: 79 (ref 40–160)

## 2020-06-08 LAB — HEMOGLOBIN A1C: Hemoglobin A1C: 6.3

## 2020-06-10 ENCOUNTER — Ambulatory Visit: Payer: Self-pay | Attending: Internal Medicine

## 2020-06-10 DIAGNOSIS — Z23 Encounter for immunization: Secondary | ICD-10-CM

## 2020-06-10 NOTE — Progress Notes (Signed)
   Covid-19 Vaccination Clinic  Name:  LAYAN ZALENSKI    MRN: [id number] DOB: April 09, 1960  06/10/2020  Ms. Toelle was observed post Covid-19 immunization for 15 minutes without incident. She was provided with Vaccine Information Sheet and instruction to access the V-Safe system.   Ms. Surratt was instructed to call 911 with any severe reactions post vaccine: Marland Kitchen Difficulty breathing  . Swelling of face and throat  . A fast heartbeat  . A bad rash all over body  . Dizziness and weakness   Immunizations Administered    Name Date Dose VIS Date Route   PFIZER Comrnaty(Gray TOP) Covid-19 Vaccine 06/10/2020 10:18 AM 0.3 mL 01/29/2020 Intramuscular   Manufacturer: [employer], [employer]   Lot: [id number]   NDC: [phone]

## 2020-06-11 ENCOUNTER — Other Ambulatory Visit (HOSPITAL_BASED_OUTPATIENT_CLINIC_OR_DEPARTMENT_OTHER): Payer: Self-pay

## 2020-06-11 MED ORDER — PFIZER-BIONT COVID-19 VAC-TRIS 30 MCG/0.3ML IM SUSP
INTRAMUSCULAR | 0 refills | Status: DC
  Filled 2020-06-11: qty 0.3, 1d supply, fill #0

## 2020-06-14 ENCOUNTER — Other Ambulatory Visit (HOSPITAL_BASED_OUTPATIENT_CLINIC_OR_DEPARTMENT_OTHER): Payer: Self-pay

## 2020-08-05 ENCOUNTER — Other Ambulatory Visit: Payer: Self-pay | Admitting: Physician Assistant

## 2020-08-05 DIAGNOSIS — Z1231 Encounter for screening mammogram for malignant neoplasm of breast: Secondary | ICD-10-CM

## 2020-08-05 DIAGNOSIS — E785 Hyperlipidemia, unspecified: Secondary | ICD-10-CM | POA: Insufficient documentation

## 2020-09-29 ENCOUNTER — Other Ambulatory Visit: Payer: Self-pay

## 2020-09-29 ENCOUNTER — Ambulatory Visit
Admission: RE | Admit: 2020-09-29 | Discharge: 2020-09-29 | Disposition: A | Discharge status: Home / Self Care | Payer: BC Managed Care – PPO | Source: Ambulatory Visit | Attending: Physician Assistant | Admitting: Physician Assistant

## 2020-09-29 DIAGNOSIS — Z1231 Encounter for screening mammogram for malignant neoplasm of breast: Secondary | ICD-10-CM

## 2021-01-13 ENCOUNTER — Encounter (HOSPITAL_COMMUNITY): Payer: Self-pay

## 2021-01-13 ENCOUNTER — Emergency Department (HOSPITAL_COMMUNITY): Payer: BC Managed Care – PPO

## 2021-01-13 ENCOUNTER — Emergency Department (HOSPITAL_COMMUNITY)
Admission: EM | Admit: 2021-01-13 | Discharge: 2021-01-13 | Disposition: A | Discharge status: Home / Self Care | Payer: BC Managed Care – PPO | Attending: Emergency Medicine | Admitting: Emergency Medicine

## 2021-01-13 DIAGNOSIS — Z20822 Contact with and (suspected) exposure to covid-19: Secondary | ICD-10-CM | POA: Insufficient documentation

## 2021-01-13 DIAGNOSIS — R059 Cough, unspecified: Secondary | ICD-10-CM | POA: Diagnosis present

## 2021-01-13 DIAGNOSIS — J209 Acute bronchitis, unspecified: Secondary | ICD-10-CM | POA: Insufficient documentation

## 2021-01-13 DIAGNOSIS — J45901 Unspecified asthma with (acute) exacerbation: Secondary | ICD-10-CM

## 2021-01-13 LAB — RESP PANEL BY RT-PCR (FLU A&B, COVID) ARPGX2
Influenza A by PCR: NEGATIVE
Influenza B by PCR: NEGATIVE
SARS Coronavirus 2 by RT PCR: NEGATIVE

## 2021-01-13 LAB — CBC WITH DIFFERENTIAL/PLATELET
Abs Immature Granulocytes: 0.01 10*3/uL (ref 0.00–0.07)
Basophils Absolute: 0.1 10*3/uL (ref 0.0–0.1)
Basophils Relative: 1 %
Eosinophils Absolute: 0.4 10*3/uL (ref 0.0–0.5)
Eosinophils Relative: 7 %
HCT: 42.3 % (ref 36.0–46.0)
Hemoglobin: 13.5 g/dL (ref 12.0–15.0)
Immature Granulocytes: 0 %
Lymphocytes Relative: 20 %
Lymphs Abs: 1 10*3/uL (ref 0.7–4.0)
MCH: 29.5 pg (ref 26.0–34.0)
MCHC: 31.9 g/dL (ref 30.0–36.0)
MCV: 92.6 fL (ref 80.0–100.0)
Monocytes Absolute: 0.5 10*3/uL (ref 0.1–1.0)
Monocytes Relative: 11 %
Neutro Abs: 3.1 10*3/uL (ref 1.7–7.7)
Neutrophils Relative %: 61 %
Platelets: 285 10*3/uL (ref 150–400)
RBC: 4.57 MIL/uL (ref 3.87–5.11)
RDW: 13.9 % (ref 11.5–15.5)
WBC: 5 10*3/uL (ref 4.0–10.5)
nRBC: 0 % (ref 0.0–0.2)

## 2021-01-13 LAB — BASIC METABOLIC PANEL
Anion gap: 8 (ref 5–15)
BUN: 9 mg/dL (ref 8–23)
CO2: 30 mmol/L (ref 22–32)
Calcium: 9.4 mg/dL (ref 8.9–10.3)
Chloride: 99 mmol/L (ref 98–111)
Creatinine, Ser: 0.89 mg/dL (ref 0.44–1.00)
GFR, Estimated: >60 mL/min (ref >60)
Glucose, Bld: 114 mg/dL — ABNORMAL HIGH (ref 70–99)
Potassium: 4.4 mmol/L (ref 3.5–5.1)
Sodium: 137 mmol/L (ref 135–145)

## 2021-01-13 MED ORDER — ALBUTEROL SULFATE (2.5 MG/3ML) 0.083% IN NEBU
5.0000 mg | INHALATION_SOLUTION | Freq: Once | RESPIRATORY_TRACT | Status: AC
  Administered 2021-01-13: 5 mg via RESPIRATORY_TRACT
  Filled 2021-01-13: qty 6

## 2021-01-13 MED ORDER — PREDNISONE 10 MG (21) PO TBPK: ORAL_TABLET | Freq: Every day | ORAL | 0 refills | Status: DC

## 2021-01-13 MED ORDER — IPRATROPIUM BROMIDE 0.02 % IN SOLN
0.5000 mg | Freq: Once | RESPIRATORY_TRACT | Status: AC
Start: 2021-01-13 — End: 2021-01-13
  Administered 2021-01-13: 0.5 mg via RESPIRATORY_TRACT
  Filled 2021-01-13: qty 2.5

## 2021-01-13 MED ORDER — IPRATROPIUM-ALBUTEROL 0.5-2.5 (3) MG/3ML IN SOLN
3.0000 mL | Freq: Once | RESPIRATORY_TRACT | Status: AC
  Administered 2021-01-13: 3 mL via RESPIRATORY_TRACT
  Filled 2021-01-13: qty 3

## 2021-01-13 MED ORDER — HYDROCOD POLST-CPM POLST ER 10-8 MG/5ML PO SUER: 5.0000 mL | Freq: Two times a day (BID) | ORAL | 0 refills | Status: DC | PRN

## 2021-01-13 MED ORDER — METHYLPREDNISOLONE SODIUM SUCC 125 MG IJ SOLR
125.0000 mg | Freq: Once | INTRAMUSCULAR | Status: AC
  Administered 2021-01-13: 125 mg via INTRAVENOUS
  Filled 2021-01-13: qty 2

## 2021-01-13 MED ORDER — HYDROCOD POLST-CPM POLST ER 10-8 MG/5ML PO SUER
5.0000 mL | Freq: Once | ORAL | Status: AC
  Administered 2021-01-13: 5 mL via ORAL
  Filled 2021-01-13: qty 5

## 2021-01-13 MED ORDER — DEXAMETHASONE 4 MG PO TABS
10.0000 mg | ORAL_TABLET | Freq: Once | ORAL | Status: AC
  Administered 2021-01-13: 10 mg via ORAL
  Filled 2021-01-13: qty 1

## 2021-01-13 NOTE — ED Provider Notes (Signed)
[facility] EMERGENCY DEPARTMENT Provider Note   CSN: [id number] Arrival date & time: 01/13/21  0400     History Chief Complaint  Patient presents with   Asthma    Deborah Fuller is a 60 y.o. female.  The history is provided by the patient.  Asthma She has history of bronchitis and comes in with about 2 weeks of constant coughing productive of some clear sputum.  There is associated dyspnea.  She has been using her home albuterol nebulizer which has been giving her temporary relief.  However, tonight, the cough was much more severe and she was unable to sleep so she came into the emergency department.  She denies fever, chills, sweats.  She denies chest pain.  She denies arthralgias or myalgias.  She denies any sick contacts.   History reviewed. No pertinent past medical history.  There are no problems to display for this patient.   Past Surgical History:  Procedure Laterality Date   CESAREAN SECTION  1994     OB History   No obstetric history on file.     No family history on file.  Social History   Tobacco Use   Smoking status: Never    Home Medications Prior to Admission medications   Medication Sig Start Date End Date Taking? Authorizing Provider  COVID-19 mRNA Vac-TriS, Pfizer, (PFIZER-BIONT COVID-19 VAC-TRIS) SUSP injection Inject into the muscle. 06/10/20   Judyann Munson, MD    Allergies    Patient has no known allergies.  Review of Systems   Review of Systems  All other systems reviewed and are negative.  Physical Exam Updated Vital Signs BP (!) 106/94 (BP Location: Right Arm)   Pulse (!) 109   Temp 98.2 F (36.8 C) (Oral)   Resp (!) 30   Ht 5\' 4"  (1.626 m)   Wt 94.8 kg   SpO2 90%   BMI 35.87 kg/m   Physical Exam Vitals and nursing note reviewed.  60 year old female, appears mildly dyspneic at rest, but is in no acute distress. Vital signs are significant for elevated heart rate and respiratory rate. Oxygen saturation  is 90%, which is normal, but borderline. Head is normocephalic and atraumatic. PERRLA, EOMI. Oropharynx is clear. Neck is nontender and supple without adenopathy or JVD. Back is nontender and there is no CVA tenderness. Lungs have coarse breath sounds with scattered expiratory wheezes.  There are no rales or rhonchi. Chest is nontender.  She is not using accessory muscles of respiration. Heart has regular rate and rhythm without murmur. Abdomen is soft, flat, nontender. Extremities have no cyanosis or edema, full range of motion is present. Skin is warm and dry without rash. Neurologic: Mental status is normal, cranial nerves are intact, moves all extremities equally.  ED Results / Procedures / Treatments   Labs (all labs ordered are listed, but only abnormal results are displayed) Labs Reviewed  BASIC METABOLIC PANEL - Abnormal; Notable for the following components:      Result Value   Glucose, Bld 114 (*)    All other components within normal limits  RESP PANEL BY RT-PCR (FLU A&B, COVID) ARPGX2  CBC WITH DIFFERENTIAL/PLATELET   Radiology DG Chest Port 1 View  Result Date: 01/13/2021 CLINICAL DATA:  Cough EXAM: PORTABLE CHEST 1 VIEW COMPARISON:  05/21/2009 FINDINGS: Normal heart size and mediastinal contours. No acute infiltrate or edema. No effusion or pneumothorax. No acute osseous findings. IMPRESSION: Negative chest. Electronically Signed   By: 07/21/2009  M.D.   On: 01/13/2021 05:55    Procedures Procedures   Medications Ordered in ED Medications  ipratropium-albuterol (DUONEB) 0.5-2.5 (3) MG/3ML nebulizer solution 3 mL (has no administration in time range)  methylPREDNISolone sodium succinate (SOLU-MEDROL) 125 mg/2 mL injection 125 mg (has no administration in time range)  albuterol (PROVENTIL) (2.5 MG/3ML) 0.083% nebulizer solution 5 mg (5 mg Nebulization Given 01/13/21 0435)  ipratropium (ATROVENT) nebulizer solution 0.5 mg (0.5 mg Nebulization Given 01/13/21 0435)   dexamethasone (DECADRON) tablet 10 mg (10 mg Oral Given 01/13/21 0435)    ED Course  I have reviewed the triage vital signs and the nursing notes.  Pertinent labs & imaging results that were available during my care of the patient were reviewed by me and considered in my medical decision making (see chart for details).    MDM Rules/Calculators/A&P                         Exacerbation of asthma/bronchitis.  Will check chest x-ray, respiratory pathogen panel and give albuterol with ipratropium as well as initial dose of steroids.  Old records are reviewed, and she has no relevant past visits.  She had moderate improvement with initial nebulizer treatment, this will be repeated.  Chest x-ray shows no evidence of pneumonia.  Respiratory pathogen panel is negative for influenza and COVID-19.  Screening labs are unremarkable.  Case is signed out to Dr. Stevie Kern.  Final Clinical Impression(s) / ED Diagnoses Final diagnoses:  Acute bronchitis with bronchospasm    Rx / DC Orders ED Discharge Orders     None        Dione Booze, MD 01/13/21 [phone]

## 2021-01-13 NOTE — ED Provider Notes (Addendum)
Signout note  60 year old lady with cough, shortness of breath.  Suspected asthma exacerbation.  Given steroids, breathing treatment.  Plan/dispo pending patient reassessment.  7:38 AM received sign out from [place] - plan to reassess after neb treatment  8:30 AM reassessed patient, wheezing has cleared but patient is currently having bad coughing spell, will give dose of Tussionex and reassess  8:55 AM reassessed patient, she is resting comfortably in bed, coughing has resolved, she feels her symptoms are much improved, will check ambulation trial  9:22 AM patient did well on ambulation trial, oxygen saturations currently ranging from 90 to 93% on room air, remained stable on ambulation.  Based on the borderline oxygen saturations, offered admission to patient.  Due to her improvement in symptoms, she desires to go home.  Given her overall well appearance, believe this is a reasonable option.  Will give short Rx for cough medicine at home as well as course of steroids.  Recommended patient have close follow-up with her primary doctor, reviewed strict return precautions should her symptoms worsen.   After the discussed management above, the patient was determined to be safe for discharge.  The patient was in agreement with this plan and all questions regarding their care were answered.  ED return precautions were discussed and the patient will return to the ED with any significant worsening of condition.   Milagros Loll, MD 01/13/21 [phone]

## 2021-01-13 NOTE — ED Provider Notes (Signed)
MSE was initiated and I personally evaluated the patient and placed orders (if any) at  4:20 AM on January 13, 2021.   Patient with history of asthma here with uncontrolled wheezing despite 4 nebulizer treatments yesterday. No fever.   Today's Vitals   01/13/21 0413 01/13/21 0417  BP: 140/85   Pulse: (!) 106   Resp: (!) 22   Temp: 98.2 F (36.8 C)   TempSrc: Oral   SpO2: 91%   PainSc:  0-No pain   There is no height or weight on file to calculate BMI.  Wheezing bilaterally, decreased air movement RRR  The patient appears stable so that the remainder of the MSE may be completed by another provider.   Elpidio Anis, PA-C 01/13/21 0422    Dione Booze, MD 01/13/21 [phone]

## 2021-01-13 NOTE — ED Triage Notes (Signed)
Pt repots that she was diagnosed with asthma several weeks ago and feels that her breathing is not getting better.

## 2021-01-13 NOTE — Discharge Instructions (Addendum)
Please follow-up with your primary care doctor for recheck tomorrow or Monday.  If you develop any worsening of your breathing, chest pain or other new concerning symptom, come back to ER for reassessment.

## 2021-01-19 ENCOUNTER — Telehealth: Payer: Self-pay

## 2021-01-19 DIAGNOSIS — R1310 Dysphagia, unspecified: Secondary | ICD-10-CM | POA: Insufficient documentation

## 2021-01-19 NOTE — Telephone Encounter (Signed)
Okay to schedule NP appt- please make it several weeks out- she will need to come by the office to sign release of information for her previous PCP so we can get records before her appt. Thank you.

## 2021-01-19 NOTE — Telephone Encounter (Signed)
-----   Message from Haze Rushing sent at 01/18/2021  4:46 PM EST ----- Regarding: New patient request Good afternoon Dr. Drue Novel,   Avel Sensor spouse Jasmain Ahlberg is a pt of yours and she would like to know if it is possible for her to establish care with you as well.   Thank you,  Shema.

## 2021-01-19 NOTE — Telephone Encounter (Signed)
Please advise 

## 2021-01-19 NOTE — Telephone Encounter (Signed)
NP appt scheduled 02/01/21.

## 2021-01-19 NOTE — Telephone Encounter (Signed)
Yes, is ok

## 2021-02-01 ENCOUNTER — Ambulatory Visit (INDEPENDENT_AMBULATORY_CARE_PROVIDER_SITE_OTHER): Payer: BC Managed Care – PPO | Admitting: Internal Medicine

## 2021-02-01 ENCOUNTER — Encounter: Payer: Self-pay | Admitting: Internal Medicine

## 2021-02-01 VITALS — BP 116/68 | HR 74 | Temp 98.0°F | Resp 16 | Ht 64.0 in | Wt 194.5 lb

## 2021-02-01 DIAGNOSIS — Z23 Encounter for immunization: Secondary | ICD-10-CM | POA: Diagnosis not present

## 2021-02-01 DIAGNOSIS — E1169 Type 2 diabetes mellitus with other specified complication: Secondary | ICD-10-CM | POA: Diagnosis not present

## 2021-02-01 DIAGNOSIS — J4540 Moderate persistent asthma, uncomplicated: Secondary | ICD-10-CM

## 2021-02-01 DIAGNOSIS — Z01419 Encounter for gynecological examination (general) (routine) without abnormal findings: Secondary | ICD-10-CM

## 2021-02-01 DIAGNOSIS — Z09 Encounter for follow-up examination after completed treatment for conditions other than malignant neoplasm: Secondary | ICD-10-CM | POA: Insufficient documentation

## 2021-02-01 DIAGNOSIS — E785 Hyperlipidemia, unspecified: Secondary | ICD-10-CM | POA: Diagnosis not present

## 2021-02-01 LAB — HEPATIC FUNCTION PANEL
ALT: 23 U/L (ref 0–35)
AST: 26 U/L (ref 0–37)
Albumin: 4.3 g/dL (ref 3.5–5.2)
Alkaline Phosphatase: 64 U/L (ref 39–117)
Bilirubin, Direct: 0.1 mg/dL (ref 0.0–0.3)
Total Bilirubin: 0.7 mg/dL (ref 0.2–1.2)
Total Protein: 7.5 g/dL (ref 6.0–8.3)

## 2021-02-01 LAB — LIPID PANEL
Cholesterol: 236 mg/dL — ABNORMAL HIGH (ref 0–200)
HDL: 90.7 mg/dL (ref >39.00)
LDL Cholesterol: 135 mg/dL — ABNORMAL HIGH (ref 0–99)
NonHDL: 145.63
Total CHOL/HDL Ratio: 3
Triglycerides: 54 mg/dL (ref 0.0–149.0)
VLDL: 10.8 mg/dL (ref 0.0–40.0)

## 2021-02-01 LAB — HEMOGLOBIN A1C: Hgb A1c MFr Bld: 6.6 % — ABNORMAL HIGH (ref 4.6–6.5)

## 2021-02-01 NOTE — Assessment & Plan Note (Signed)
New patient DM: Dx earlier this year, check A1c, LFTs. Hyperlipidemia: On Lipitor, fasting, check FLP Asthma: Onset 2018, went to the ER last month with severe exacerbation, chest x-ray no acute, was intolerant to prednisone due to nausea. In addition to [facility] and her rescue inhaler she was given a nebulizer. Overall feels better. Plan: Reassess on RTC, it is somewhat unusual to develop asthma at her age, consider PFTs or further eval. GERD: Well-controlled at this point Preventive care: Flu shot and PNM 20 today.  Encouraged to get a COVID-vaccine.  Gyn referral. Getting records from [facility]  Physicians RTC CPX at her convenience (pt likes to do it in January)

## 2021-02-01 NOTE — Progress Notes (Signed)
Subjective:    Patient ID: Deborah Fuller, female    DOB: 02-Apr-1960, 60 y.o.   MRN: [id number]  DOS:  02/01/2021 Type of visit - description: New patient, referred by her husband.  The patient is 60 years old. Past medical history was reviewed.  Started to develop asthma around 2018 with frequent bronchitis and wheezing. She had a major exacerbation November 2022, went to the ER.  Chest x-ray negative. Was recommended to continue with [facility] and was given a nebulizer. At this point, breathing has improved   Review of Systems See denies fever chills No chest pain or palpitations.  No edema No nausea or vomiting above   Past Medical History:  Diagnosis Date   Asthma    Diabetes (HCC)    GERD (gastroesophageal reflux disease)    Hyperlipidemia     Past Surgical History:  Procedure Laterality Date   CESAREAN SECTION  1994   Social History   Socioeconomic History   Marital status: Married    Spouse name: Not on file   Number of children: 4   Years of education: Not on file   Highest education level: Not on file  Occupational History   Occupation: Geologist, engineering , to retire 2022  Tobacco Use   Smoking status: Never   Smokeless tobacco: Never  Substance and Sexual Activity   Alcohol use: Yes    Comment: rarely   Drug use: Not on file   Sexual activity: Not on file  Other Topics Concern   Not on file  Social History Narrative   Household: pt, husband and youngest son   Social Determinants of Corporate investment banker Strain: Not on file  Food Insecurity: Not on file  Transportation Needs: Not on file  Physical Activity: Not on file  Stress: Not on file  Social Connections: Not on file  Intimate Partner Violence: Not on file   Family History  Problem Relation Age of Onset   Ovarian cancer Maternal Grandmother    CAD Maternal Grandfather    Diabetes Paternal Grandfather    Breast cancer Neg Hx    Colon cancer Neg Hx      Allergies as of  02/01/2021       Reactions   Shellfish Allergy Anaphylaxis, Nausea And Vomiting   Prednisone Nausea And Vomiting        Medication List        Accurate as of February 01, 2021  9:05 PM. If you have any questions, ask your nurse or doctor.          STOP taking these medications    azithromycin 250 MG tablet Commonly known as: ZITHROMAX Stopped by: Willow Ora, MD   chlorpheniramine-HYDROcodone 10-8 MG/5ML Suer Commonly known as: Tussionex Pennkinetic ER Stopped by: Willow Ora, MD   HYDROcodone bit-homatropine 5-1.5 MG/5ML syrup Commonly known as: HYCODAN Stopped by: Willow Ora, MD   predniSONE 10 MG (21) Tbpk tablet Commonly known as: STERAPRED UNI-PAK 21 TAB Stopped by: Willow Ora, MD       TAKE these medications    albuterol (2.5 MG/3ML) 0.083% nebulizer solution Commonly known as: PROVENTIL Inhale 3 mLs into the lungs every 8 (eight) hours as needed.   albuterol 108 (90 Base) MCG/ACT inhaler Commonly known as: VENTOLIN HFA Inhale 2 puffs into the lungs every 4 (four) hours as needed.   atorvastatin 20 MG tablet Commonly known as: LIPITOR Take 20 mg by mouth at bedtime.   Breo Ellipta 100-25 MCG/ACT Aepb  Generic drug: fluticasone furoate-vilanterol Inhale 1 puff into the lungs daily.   cholecalciferol 25 MCG (1000 UNIT) tablet Commonly known as: VITAMIN D Take 1,000 Units by mouth daily.   ECHINACEA PO Take 1 tablet by mouth daily.   esomeprazole 20 MG capsule Commonly known as: NEXIUM Take 20 mg by mouth daily.   multivitamin tablet Take 1 tablet by mouth daily.   OVER THE COUNTER MEDICATION Take 1 capsule by mouth daily. tumeric   vitamin B-12 1000 MCG tablet Commonly known as: CYANOCOBALAMIN Take 1,000 mcg by mouth daily.   vitamin C 1000 MG tablet Take 1,000 mg by mouth daily.   zinc gluconate 50 MG tablet Take 50 mg by mouth daily.           Objective:   Physical Exam BP 116/68 (BP Location: Left Arm, Patient Position: Sitting,  Cuff Size: Small)    Pulse 74    Temp 98 F (36.7 C) (Oral)    Resp 16    Ht 5\' 4"  (1.626 m)    Wt 194 lb 8 oz (88.2 kg)    SpO2 98%    BMI 33.39 kg/m  General:   Well developed, NAD, BMI noted. HEENT:  Normocephalic . Face symmetric, atraumatic Lungs:  CTA B Normal respiratory effort, no intercostal retractions, no accessory muscle use. Heart: RRR,  no murmur.  Lower extremities: no pretibial edema bilaterally  Skin: Not pale. Not jaundice Neurologic:  alert & oriented X3.  Speech normal, gait appropriate for age and unassisted Psych--  Cognition and judgment appear intact.  Cooperative with normal attention span and concentration.  Behavior appropriate. No anxious or depressed appearing.  Slightly decreased breath sounds at bases?    Assessment    Assessment (new patient 01/2021, referred by her husband) Diabetes dx 02-2020  Hyperlipidemia Asthma onset 2018 , never smoke, ER visit x 1 12-2020  GERD Menopausal   PLAN New patient DM: Dx earlier this year, check A1c, LFTs. Hyperlipidemia: On Lipitor, fasting, check FLP Asthma: Onset 2018, went to the ER last month with severe exacerbation, chest x-ray no acute, was intolerant to prednisone due to nausea. In addition to [facility] and her rescue inhaler she was given a nebulizer. Overall feels better. Plan: Reassess on RTC, it is somewhat unusual to develop asthma at her age, consider PFTs or further eval. GERD: Well-controlled at this point Preventive care: Flu shot and PNM 20 today.  Encouraged to get a COVID-vaccine.  Gyn referral. Getting records from [facility]  Physicians RTC CPX at her convenience (pt likes to do it in January)   This visit occurred during the SARS-CoV-2 public health emergency.  Safety protocols were in place, including screening questions prior to the visit, additional usage of staff PPE, and extensive cleaning of exam room while observing appropriate contact time as indicated for disinfecting solutions.

## 2021-02-01 NOTE — Patient Instructions (Addendum)
We are referring you to a gynecologist  Consider getting the COVID booster  GO TO THE LAB : Get the blood work     GO TO THE FRONT DESK, PLEASE SCHEDULE YOUR APPOINTMENTS Come back for   a physical exam at your convenience (January) the

## 2021-02-02 MED ORDER — ATORVASTATIN CALCIUM 40 MG PO TABS: 40.0000 mg | ORAL_TABLET | Freq: Every day | ORAL | 1 refills | Status: DC

## 2021-02-02 NOTE — Addendum Note (Signed)
Addended byConrad Vann [place] D on: 02/02/2021 01:26 PM   Modules accepted: Orders

## 2021-02-17 ENCOUNTER — Encounter: Payer: Self-pay | Admitting: Internal Medicine

## 2021-02-17 DIAGNOSIS — E559 Vitamin D deficiency, unspecified: Secondary | ICD-10-CM

## 2021-02-28 ENCOUNTER — Telehealth: Payer: Self-pay

## 2021-02-28 ENCOUNTER — Encounter: Payer: Self-pay | Admitting: Internal Medicine

## 2021-02-28 NOTE — Telephone Encounter (Addendum)
Records received from [facility]. Records placed in PCP yellow folder for review.

## 2021-03-02 ENCOUNTER — Encounter: Payer: Self-pay | Admitting: Internal Medicine

## 2021-03-02 DIAGNOSIS — K5790 Diverticulosis of intestine, part unspecified, without perforation or abscess without bleeding: Secondary | ICD-10-CM

## 2021-03-04 NOTE — Telephone Encounter (Signed)
Records sent for scan. I have requested they fax [place] pap smear from 2015 and EKGs. Requesting EGD path report from 04/02/2014 w/ Dr. Elnoria Howard.

## 2021-03-04 NOTE — Telephone Encounter (Addendum)
100s of patients reviewed.  Will only keep the most recent and relevant  EGD 04/02/2014: Essentially normal, pathology ?  (Addendum: Reviewed report, pathology negative) Colonoscopy 04/02/2014: Diverticuli, no other findings, next in 10 years. Also noted that she had negative Cologuard 2019 and 03/2020. Office visit note 12/15/2015: Was a started on Breo and referred to pulmonary Low vitamin D Dx 2021

## 2021-03-07 ENCOUNTER — Encounter: Payer: Self-pay | Admitting: Internal Medicine

## 2021-03-07 ENCOUNTER — Ambulatory Visit (INDEPENDENT_AMBULATORY_CARE_PROVIDER_SITE_OTHER): Payer: BC Managed Care – PPO | Admitting: Internal Medicine

## 2021-03-07 VITALS — BP 126/84 | HR 45 | Temp 97.6°F | Resp 16 | Ht 64.0 in | Wt 199.5 lb

## 2021-03-07 DIAGNOSIS — E559 Vitamin D deficiency, unspecified: Secondary | ICD-10-CM | POA: Diagnosis not present

## 2021-03-07 DIAGNOSIS — Z0000 Encounter for general adult medical examination without abnormal findings: Secondary | ICD-10-CM | POA: Diagnosis not present

## 2021-03-07 DIAGNOSIS — Z114 Encounter for screening for human immunodeficiency virus [HIV]: Secondary | ICD-10-CM

## 2021-03-07 DIAGNOSIS — E1169 Type 2 diabetes mellitus with other specified complication: Secondary | ICD-10-CM | POA: Diagnosis not present

## 2021-03-07 DIAGNOSIS — E785 Hyperlipidemia, unspecified: Secondary | ICD-10-CM | POA: Diagnosis not present

## 2021-03-07 DIAGNOSIS — Z23 Encounter for immunization: Secondary | ICD-10-CM | POA: Diagnosis not present

## 2021-03-07 DIAGNOSIS — Z1159 Encounter for screening for other viral diseases: Secondary | ICD-10-CM

## 2021-03-07 NOTE — Telephone Encounter (Signed)
Noted! Thank you

## 2021-03-07 NOTE — Progress Notes (Signed)
Subjective:    Patient ID: Deborah Fuller, female    DOB: 1960/06/19, 61 y.o.   MRN: [id number]  DOS:  03/07/2021 Type of visit - description: Here for CPX  Since the last office visit is doing well. Has no major concerns. Asthma is completely well controlled on Breo.   Review of Systems  Other than above, a 14 point review of systems is negative      Past Medical History:  Diagnosis Date   Allergies    Anxiety    Asthma    Diabetes (HCC)    Diverticulosis    cscope 03/2014   GERD (gastroesophageal reflux disease)    Hyperlipidemia    Vitamin D deficiency     Past Surgical History:  Procedure Laterality Date   BREAST BIOPSY Left 09/14/2005   CESAREAN SECTION  1994    Social History   Socioeconomic History   Marital status: Married    Spouse name: Not on file   Number of children: 4   Years of education: Not on file   Highest education level: Not on file  Occupational History   Occupation: Geologist, engineering , to retire 2023  Tobacco Use   Smoking status: Never   Smokeless tobacco: Never  Substance and Sexual Activity   Alcohol use: Yes    Comment: rarely   Drug use: Not on file   Sexual activity: Not on file  Other Topics Concern   Not on file  Social History Narrative   Household: pt, husband and youngest son   Social Determinants of Corporate investment banker Strain: Not on file  Food Insecurity: Not on file  Transportation Needs: Not on file  Physical Activity: Not on file  Stress: Not on file  Social Connections: Not on file  Intimate Partner Violence: Not on file    Current Outpatient Medications  Medication Instructions   albuterol (PROVENTIL) (2.5 MG/3ML) 0.083% nebulizer solution 3 mLs, Inhalation, Every 8 hours PRN   albuterol (VENTOLIN HFA) 108 (90 Base) MCG/ACT inhaler 2 puffs, Every 4 hours PRN   atorvastatin (LIPITOR) 40 mg, Oral, Daily at bedtime   BREO ELLIPTA 100-25 MCG/ACT AEPB 1 puff, Inhalation, Daily   cholecalciferol  (VITAMIN D) 1,000 Units, Oral, Daily   ECHINACEA PO 1 tablet, Oral, Daily   esomeprazole (NEXIUM) 20 mg, Oral, Daily   Multiple Vitamin (MULTIVITAMIN) tablet 1 tablet, Oral, Daily   OVER THE COUNTER MEDICATION 1 capsule, Oral, Daily, tumeric   vitamin B-12 (CYANOCOBALAMIN) 1,000 mcg, Oral, Daily   vitamin C 1,000 mg, Oral, Daily   zinc gluconate 50 mg, Oral, Daily       Objective:   Physical Exam BP 126/84 (BP Location: Left Arm, Patient Position: Sitting, Cuff Size: Small)    Pulse (!) 45    Temp 97.6 F (36.4 C) (Oral)    Resp 16    Ht 5\' 4"  (1.626 m)    Wt 199 lb 8 oz (90.5 kg)    SpO2 98%    BMI 34.24 kg/m  General: Well developed, NAD, BMI noted Neck: No  thyromegaly  HEENT:  Normocephalic . Face symmetric, atraumatic Lungs:  CTA B Normal respiratory effort, no intercostal retractions, no accessory muscle use. Heart: RRR,  no murmur.  Abdomen:  Not distended, soft, non-tender. No rebound or rigidity.   Lower extremities: no pretibial edema bilaterally  Skin: Exposed areas without rash. Not pale. Not jaundice Neurologic:  alert & oriented X3.  Speech normal, gait appropriate for  age and unassisted Strength symmetric and appropriate for age.  Psych: Cognition and judgment appear intact.  Cooperative with normal attention span and concentration.  Behavior appropriate. No anxious or depressed appearing.     Assessment     Assessment (new patient 01/2021, referred by her husband) Diabetes dx 02-2020  Hyperlipidemia Asthma onset 2018 , never smoke, ER visit x 1 12-2020  GERD (EGD 04/02/2014, normal, BX normal). Vit d  Menopausal  FH CAD, M age 48  PLAN Here for CPX DM: Diet controlled, last A1c very good.  Check micro. Hyperlipidemia: LDL was 135 last month, Lipitor increased to 40 mg.  She is fasting. Although early, I'll recheck labs. Asthma: On Breo, not using albuterol.  No change for now.  Consider PFTs at some point. Vitamin D deficiency: on OTCs, checking  labs RTC 4 months     This visit occurred during the SARS-CoV-2 public health emergency.  Safety protocols were in place, including screening questions prior to the visit, additional usage of staff PPE, and extensive cleaning of exam room while observing appropriate contact time as indicated for disinfecting solutions.

## 2021-03-07 NOTE — Patient Instructions (Addendum)
Per our records you are due for your diabetic eye exam. Please contact your eye doctor to schedule an appointment. Please have them send copies of your office visit notes to [place]. Our fax number is [phone]. If you need a referral to an eye doctor please let us know.   Please call the gynecology office at [phone] to schedule your appointment.   GO TO THE LAB : Get the blood work     GO TO THE FRONT DESK, PLEASE SCHEDULE YOUR APPOINTMENTS  Come back in 4 months for a checkup, will do your shingles #2 vaccine then.    "Living will", "Health Care Power of attorney": Advanced care planning  (If you already have a living will or healthcare power of attorney, please bring the copy to be scanned in your chart.)  Advance care planning is a process that supports adults in  understanding and sharing their preferences regarding future medical care.   The patient's preferences are recorded in documents called Advance Directives.    Advanced directives are completed (and can be modified at any time) while the patient is in full mental capacity.   The documentation should be available at all times to the patient, the family and the healthcare providers.  Bring in a copy to be scanned in your chart is an excellent idea and is recommended   This legal documents direct treatment decision making and/or appoint a surrogate to make the decision if the patient is not capable to do so.    Advance directives can be documented in many types of formats,  documents have names such as:  Lliving will  Durable power of attorney for healthcare (healthcare proxy or healthcare power of attorney)  Combined directives  Physician orders for life-sustaining treatment    More information at:  [email or web address]

## 2021-03-07 NOTE — Telephone Encounter (Signed)
We received the path report from EGD done on 04/02/2014. I have placed this in your yellow folder.

## 2021-03-08 ENCOUNTER — Encounter: Payer: Self-pay | Admitting: Internal Medicine

## 2021-03-08 DIAGNOSIS — Z0000 Encounter for general adult medical examination without abnormal findings: Secondary | ICD-10-CM | POA: Insufficient documentation

## 2021-03-08 LAB — HIV ANTIBODY (ROUTINE TESTING W REFLEX): HIV 1&2 Ab, 4th Generation: NONREACTIVE

## 2021-03-08 LAB — LIPID PANEL
Cholesterol: 189 mg/dL (ref 0–200)
HDL: 91.1 mg/dL (ref >39.00)
LDL Cholesterol: 89 mg/dL (ref 0–99)
NonHDL: 97.61
Total CHOL/HDL Ratio: 2
Triglycerides: 43 mg/dL (ref 0.0–149.0)
VLDL: 8.6 mg/dL (ref 0.0–40.0)

## 2021-03-08 LAB — HEPATITIS C ANTIBODY
Hepatitis C Ab: NONREACTIVE
SIGNAL TO CUT-OFF: 0.15 (ref <1.00)

## 2021-03-08 LAB — AST: AST: 26 U/L (ref 0–37)

## 2021-03-08 LAB — MICROALBUMIN / CREATININE URINE RATIO
Creatinine,U: 163.1 mg/dL
Microalb Creat Ratio: 3.3 mg/g (ref 0.0–30.0)
Microalb, Ur: 5.4 mg/dL — ABNORMAL HIGH (ref 0.0–1.9)

## 2021-03-08 LAB — ALT: ALT: 22 U/L (ref 0–35)

## 2021-03-08 LAB — VITAMIN D 25 HYDROXY (VIT D DEFICIENCY, FRACTURES): VITD: 81.7 ng/mL (ref 30.00–100.00)

## 2021-03-08 NOTE — Assessment & Plan Note (Signed)
Here for CPX DM: Diet controlled, last A1c very good.  Check micro. Hyperlipidemia: LDL was 135 last month, Lipitor increased to 40 mg.  She is fasting. Although early, I'll recheck labs. Asthma: On Breo, not using albuterol.  No change for now.  Consider PFTs at some point. Vitamin D deficiency: on OTCs, checking labs RTC 4 months

## 2021-03-08 NOTE — Assessment & Plan Note (Signed)
-  Tdap 2016 -PNM 20: December 2022 -Shingrix: #1 today, next on RTC 4 months. - COVID vaccine: Booster recommended - Had a flu shot - CCS: Colonoscopy 04/02/2014: Normal, 10 years.  (She also had Cologuards x2) - Female care: [facility] 09-2020. Was referred to gynecology last month.  Recommend to call and set up appointment: Labs: HIV, hep C, LFTs, cholesterol, vitamin D, micro ACP info provided

## 2021-03-28 ENCOUNTER — Telehealth (INDEPENDENT_AMBULATORY_CARE_PROVIDER_SITE_OTHER): Payer: BC Managed Care – PPO | Admitting: Internal Medicine

## 2021-03-28 ENCOUNTER — Encounter: Payer: Self-pay | Admitting: Internal Medicine

## 2021-03-28 VITALS — Temp 99.1°F | Ht 64.0 in | Wt 199.0 lb

## 2021-03-28 DIAGNOSIS — U071 COVID-19: Secondary | ICD-10-CM

## 2021-03-28 MED ORDER — MOLNUPIRAVIR EUA 200MG CAPSULE: 4.0000 | ORAL_CAPSULE | Freq: Two times a day (BID) | ORAL | 0 refills | Status: AC

## 2021-03-28 NOTE — Progress Notes (Signed)
Subjective:    Patient ID: Deborah Fuller, female    DOB: 20-Apr-1960, 61 y.o.   MRN: [id number]  DOS:  03/28/2021 Type of visit - description: Virtual Visit via Video Note  I connected with the above patient  by a video enabled telemedicine application and verified that I am speaking with the correct person using two identifiers.   THIS ENCOUNTER IS A VIRTUAL VISIT DUE TO COVID-19 - PATIENT WAS NOT SEEN IN THE OFFICE. PATIENT HAS CONSENTED TO VIRTUAL VISIT / TELEMEDICINE VISIT   Location of patient: home  Location of provider: office  Persons participating in the virtual visit: patient, provider   I discussed the limitations of evaluation and management by telemedicine and the availability of in person appointments. The patient expressed understanding and agreed to proceed.  Acute Symptoms last week : had a persistent headache but had two negative for COVID tests. Yesterday symptoms started acutely with chills, cough, headache. She recheck for COVID because a family member was positive and she was positive as well. She reports she had a temperature of 99.1. Some nausea, vomiting 1 or twice but she thinks is from postnasal dripping Denies generalized myalgias, no chest pain or difficulty breathing. The headache is moderate to severe, sometimes she feels she can tolerate the light. Denies any rash or neck stiffness. No asthma symptoms other than a very mild cough.    Review of Systems See above   Past Medical History:  Diagnosis Date   Allergies    Anxiety    Asthma    Diabetes (HCC)    Diverticulosis    cscope 03/2014   GERD (gastroesophageal reflux disease)    Hyperlipidemia    Vitamin D deficiency     Past Surgical History:  Procedure Laterality Date   BREAST BIOPSY Left 09/14/2005   CESAREAN SECTION  1994    Current Outpatient Medications  Medication Instructions   albuterol (PROVENTIL) (2.5 MG/3ML) 0.083% nebulizer solution 3 mLs, Inhalation, Every 8 hours  PRN   albuterol (VENTOLIN HFA) 108 (90 Base) MCG/ACT inhaler 2 puffs, Inhalation, Every 4 hours PRN   atorvastatin (LIPITOR) 40 mg, Oral, Daily at bedtime   BREO ELLIPTA 100-25 MCG/ACT AEPB 1 puff, Inhalation, Daily   cholecalciferol (VITAMIN D) 1,000 Units, Oral, Daily   ECHINACEA PO 1 tablet, Oral, Daily   esomeprazole (NEXIUM) 20 mg, Oral, Daily   Multiple Vitamin (MULTIVITAMIN) tablet 1 tablet, Oral, Daily   OVER THE COUNTER MEDICATION 1 capsule, Oral, Daily, tumeric   vitamin B-12 (CYANOCOBALAMIN) 1,000 mcg, Oral, Daily   vitamin C 1,000 mg, Oral, Daily   zinc gluconate 50 mg, Oral, Daily       Objective:   Physical Exam Temp 99.1 F (37.3 C)    Ht 5\' 4"  (1.626 m)    Wt 199 lb (90.3 kg)    BMI 34.16 kg/m  This is a virtual video visit, she is in bed, alert oriented x3, speaking in complete sentences. I asked her to move her neck and she moves without limitations.     Assessment    Assessment (new patient 01/2021, referred by her husband) Diabetes dx 02-2020  Hyperlipidemia Asthma onset 2018 , never smoke, ER visit x 1 12-2020  GERD (EGD 04/02/2014, normal, BX normal). Vit d  Menopausal  FH CAD, M age 80  PLAN COVID-19: Symptoms started last night, tested positive for COVID. She does have a severe headache, but no rash, no neck stiffness.  She does have some mild light  intolerance.  Nontoxic-appearing. Plan: Molnupiravir, rest, fluids, Tylenol. Mucinex DM as needed Work note for 1 week.  Isolate for at least 1 week.  10 days if she is not completely well after 7 days.   Okay to proceed with COVID booster in 1 month. Because the headache is intense, recommend ER if persistent fever, high fever, increased headache, rash, neck stiffness.  She verbalized understanding  I discussed the assessment and treatment plan with the patient. The patient was provided an opportunity to ask questions and all were answered. The patient agreed with the plan and demonstrated an  understanding of the instructions.   The patient was advised to call back or seek an in-person evaluation if the symptoms worsen or if the condition fails to improve as anticipated.

## 2021-03-29 NOTE — Assessment & Plan Note (Signed)
COVID-19: Symptoms started last night, tested positive for COVID. She does have a severe headache, but no rash, no neck stiffness.  She does have some mild light intolerance.  Nontoxic-appearing. Plan: Molnupiravir, rest, fluids, Tylenol. Mucinex DM as needed Work note for 1 week.  Isolate for at least 1 week.  10 days if she is not completely well after 7 days.   Okay to proceed with COVID booster in 1 month. Because the headache is intense, recommend ER if persistent fever, high fever, increased headache, rash, neck stiffness.  She verbalized understanding

## 2021-04-05 ENCOUNTER — Encounter: Payer: Self-pay | Admitting: Internal Medicine

## 2021-06-21 ENCOUNTER — Other Ambulatory Visit (HOSPITAL_COMMUNITY)
Admission: RE | Admit: 2021-06-21 | Discharge: 2021-06-21 | Disposition: A | Discharge status: Home / Self Care | Payer: BC Managed Care – PPO | Source: Ambulatory Visit | Attending: Obstetrics and Gynecology | Admitting: Obstetrics and Gynecology

## 2021-06-21 DIAGNOSIS — Z01419 Encounter for gynecological examination (general) (routine) without abnormal findings: Secondary | ICD-10-CM | POA: Diagnosis not present

## 2021-06-22 LAB — CYTOLOGY - PAP
Comment: NEGATIVE
Diagnosis: NEGATIVE
High risk HPV: NEGATIVE

## 2021-06-27 LAB — HM DIABETES EYE EXAM

## 2021-07-05 ENCOUNTER — Ambulatory Visit (INDEPENDENT_AMBULATORY_CARE_PROVIDER_SITE_OTHER): Payer: BC Managed Care – PPO | Admitting: Internal Medicine

## 2021-07-05 ENCOUNTER — Encounter: Payer: Self-pay | Admitting: Internal Medicine

## 2021-07-05 VITALS — BP 126/68 | HR 69 | Temp 99.0°F | Resp 18 | Ht 64.0 in | Wt 202.4 lb

## 2021-07-05 DIAGNOSIS — J4520 Mild intermittent asthma, uncomplicated: Secondary | ICD-10-CM | POA: Diagnosis not present

## 2021-07-05 DIAGNOSIS — E1169 Type 2 diabetes mellitus with other specified complication: Secondary | ICD-10-CM | POA: Diagnosis not present

## 2021-07-05 DIAGNOSIS — Z23 Encounter for immunization: Secondary | ICD-10-CM | POA: Diagnosis not present

## 2021-07-05 NOTE — Patient Instructions (Addendum)
?  Visit the American diabetes Association for good information about diet and exercise ?  ? ? ?GO TO THE LAB : Get the blood work   ? ? ?GO TO THE FRONT DESK, PLEASE SCHEDULE YOUR APPOINTMENTS ?Come back for a checkup in 4 months ?  ? ?Per our records you are due for your diabetic eye exam. Please contact your eye doctor to schedule an appointment. Please have them send copies of your office visit notes to [place]. Our fax number is [phone]. If you need a referral to an eye doctor please let us know. ? ? ?Recommend to proceed with covid booster (bivalent) at your pharmacy.  ?

## 2021-07-05 NOTE — Progress Notes (Signed)
? ?  Subjective:  ? ? Patient ID: Deborah Fuller, female    DOB: 1960/10/14, 60 y.o.   MRN: [id number] ? ?DOS:  07/05/2021 ?Type of visit - description: f/u ? ?After last visit was diagnosed with COVID, symptoms quickly resolved. ?Asthma has been silent until yesterday when she developed some shortness of breath and difficulty catching her breath. ?No fever chills ?No runny nose ?She did one albuterol puff and one nebulization and now she is back to normal. ?Denies any lower extremity paresthesias.  ? ?Review of Systems ?See above  ? ?Past Medical History:  ?Diagnosis Date  ? Allergies   ? Anxiety   ? Asthma   ? Diabetes (HCC)   ? Diverticulosis   ? cscope 03/2014  ? GERD (gastroesophageal reflux disease)   ? Hyperlipidemia   ? Vitamin D deficiency   ? ? ?Past Surgical History:  ?Procedure Laterality Date  ? BREAST BIOPSY Left 09/14/2005  ? CESAREAN SECTION  1994  ? ? ?Current Outpatient Medications  ?Medication Instructions  ? albuterol (PROVENTIL) (2.5 MG/3ML) 0.083% nebulizer solution 3 mLs, Inhalation, Every 8 hours PRN  ? albuterol (VENTOLIN HFA) 108 (90 Base) MCG/ACT inhaler 2 puffs, Every 4 hours PRN  ? atorvastatin (LIPITOR) 40 mg, Oral, Daily at bedtime  ? BREO ELLIPTA 100-25 MCG/ACT AEPB 1 puff, Inhalation, Daily  ? cholecalciferol (VITAMIN D) 1,000 Units, Oral, Daily  ? ECHINACEA PO 1 tablet, Oral, Daily  ? esomeprazole (NEXIUM) 20 mg, Oral, Daily  ? Multiple Vitamin (MULTIVITAMIN) tablet 1 tablet, Oral, Daily  ? OVER THE COUNTER MEDICATION 1 capsule, Oral, Daily, tumeric  ? vitamin B-12 (CYANOCOBALAMIN) 1,000 mcg, Oral, Daily  ? vitamin C 1,000 mg, Oral, Daily  ? zinc gluconate 50 mg, Oral, Daily  ? ? ?   ?Objective:  ? Physical Exam ?BP 126/68 (BP Location: Left Arm, Patient Position: Sitting, Cuff Size: Normal)   Pulse 69   Temp 99 ?F (37.2 ?C) (Oral)   Resp 18   Ht 5\' 4"  (1.626 m)   Wt 202 lb 6 oz (91.8 kg)   SpO2 96%   BMI 34.74 kg/m?  ?General:   ?Well developed, NAD, BMI noted. ?HEENT:   ?Normocephalic . Face symmetric, atraumatic ?Lungs:  ?CTA B ?Normal respiratory effort, no intercostal retractions, no accessory muscle use. ?Heart: RRR,  no murmur.  ?DM foot exam: No edema, good pedal pulses, pinprick examination normal. ?Skin: Not pale. Not jaundice ?Neurologic:  ?alert & oriented X3.  ?Speech normal, gait appropriate for age and unassisted ?Psych--  ?Cognition and judgment appear intact.  ?Cooperative with normal attention span and concentration.  ?Behavior appropriate. ?No anxious or depressed appearing.  ? ?   ?Assessment   ? ? Assessment (new patient 01/2021, referred by her husband) ?Diabetes dx 02-2020  ?Hyperlipidemia ?Asthma onset 2018 , never smoke, ER visit x 1 12-2020  ?GERD (EGD 04/02/2014, normal, BX normal). ?Vit d  ?Menopausal  ?FH CAD, M age 65 ? ?PLAN ?DM: ?Diet controlled, I explained the concept of A1c to the patient, last A1c 6.6. ?We had extended discussion about diet and exercise, advised to visit the ADA website for more information. ?Feet exam negative, check a BMP and A1c, further advised for results ?Asthma: On Breo, has not needed albuterol until yesterday, today she is asymptomatic. ?Preventive care: Shingrix No. 2 today ?Social: To retire in June 2023. ?RTC 4 months ?  ?

## 2021-07-06 LAB — BASIC METABOLIC PANEL
BUN: 11 mg/dL (ref 6–23)
CO2: 29 mEq/L (ref 19–32)
Calcium: 9.9 mg/dL (ref 8.4–10.5)
Chloride: 103 mEq/L (ref 96–112)
Creatinine, Ser: 0.94 mg/dL (ref 0.40–1.20)
GFR: 65.2 mL/min (ref >60.00)
Glucose, Bld: 80 mg/dL (ref 70–99)
Potassium: 4.5 mEq/L (ref 3.5–5.1)
Sodium: 141 mEq/L (ref 135–145)

## 2021-07-06 LAB — HEMOGLOBIN A1C: Hgb A1c MFr Bld: 6.3 % (ref 4.6–6.5)

## 2021-07-06 NOTE — Assessment & Plan Note (Signed)
DM: ?Diet controlled, I explained the concept of A1c to the patient, last A1c 6.6. ?We had extended discussion about diet and exercise, advised to visit the ADA website for more information. ?Feet exam negative, check a BMP and A1c, further advised for results ?Asthma: On Breo, has not needed albuterol until yesterday, today she is asymptomatic. ?Preventive care: Shingrix No. 2 today ?Social: To retire in June 2023. ?RTC 4 months ?  ?

## 2021-09-15 ENCOUNTER — Encounter: Payer: BC Managed Care – PPO | Admitting: Student

## 2021-10-06 ENCOUNTER — Encounter: Payer: Self-pay | Admitting: Internal Medicine

## 2021-10-19 ENCOUNTER — Telehealth: Payer: Self-pay | Admitting: Internal Medicine

## 2021-10-19 MED ORDER — ALBUTEROL SULFATE HFA 108 (90 BASE) MCG/ACT IN AERS: 2.0000 | INHALATION_SPRAY | RESPIRATORY_TRACT | 5 refills | Status: DC | PRN

## 2021-10-19 MED ORDER — ALBUTEROL SULFATE (2.5 MG/3ML) 0.083% IN NEBU: 3.0000 mL | INHALATION_SOLUTION | Freq: Three times a day (TID) | RESPIRATORY_TRACT | 3 refills | Status: DC | PRN

## 2021-10-19 NOTE — Telephone Encounter (Signed)
Rxs sent

## 2021-10-19 NOTE — Telephone Encounter (Signed)
Medication:   albuterol (VENTOLIN HFA) 108 (90 Base) MCG/ACT inhaler [[id number]]   albuterol (PROVENTIL) (2.5 MG/3ML) 0.083% nebulizer solution [[id number]]   Has the patient contacted their pharmacy? No. (If no, request that the patient contact the pharmacy for the refill.) (If yes, when and what did the pharmacy advise?)  Preferred Pharmacy (with phone number or street name):   [id number] - [place], [place] - [address] AT [facility] [address]  [address] [place] [address]  Phone:  [phone]  Fax:  [phone]  Agent: Please be advised that RX refills may take up to 3 business days. We ask that you follow-up with your pharmacy.

## 2021-11-03 ENCOUNTER — Encounter: Payer: Self-pay | Admitting: Internal Medicine

## 2021-11-03 ENCOUNTER — Ambulatory Visit: Payer: BC Managed Care – PPO | Admitting: Internal Medicine

## 2021-11-03 VITALS — BP 124/78 | HR 75 | Temp 98.0°F | Resp 18 | Ht 64.0 in | Wt 212.5 lb

## 2021-11-03 DIAGNOSIS — E1169 Type 2 diabetes mellitus with other specified complication: Secondary | ICD-10-CM

## 2021-11-03 DIAGNOSIS — E785 Hyperlipidemia, unspecified: Secondary | ICD-10-CM | POA: Diagnosis not present

## 2021-11-03 DIAGNOSIS — J4520 Mild intermittent asthma, uncomplicated: Secondary | ICD-10-CM | POA: Diagnosis not present

## 2021-11-03 LAB — HEMOGLOBIN A1C: Hgb A1c MFr Bld: 6.5 % (ref 4.6–6.5)

## 2021-11-03 NOTE — Assessment & Plan Note (Signed)
DM: Diet controlled, + weight gain, diet has not been very good lately.  She remains active and takes a walk daily.  We talk about the importance of a structured- healthy diet. will check A1c.  Start medications only if  a1c  is much worse otherwise work on diet and follow-up in few months. Hyperlipidemia: Controlled on Lipitor. Asthma: Symptoms are seasonal, typically in the fall, use inhalers as needed, rec to  try to avoid known triggers. Stress: Patient's mother was Dx with breast cancer, fortunately she finished all treatments and she is feeling well. Preventive care: Rec flu and COVID boosters. RTC CPX 02-2022

## 2021-11-03 NOTE — Patient Instructions (Addendum)
Recommend to proceed with a flu shot and a covid booster (bivalent) at your pharmacy.     GO TO THE LAB : Get the blood work     GO TO THE FRONT DESK, PLEASE SCHEDULE YOUR APPOINTMENTS Come back for a physical exam by 02-2022  Per our records you are due for your diabetic eye exam. Please contact your eye doctor to schedule an appointment. Please have them send copies of your office visit notes to [place]. Our fax number is [phone]. If you need a referral to an eye doctor please let us know.

## 2021-11-03 NOTE — Progress Notes (Signed)
Subjective:    Patient ID: Deborah Fuller, female    DOB: 02-06-1961, 61 y.o.   MRN: [id number]  DOS:  11/03/2021 Type of visit - description: f/u  Since the last office visit, she was under a lot of stress, her mother had breast cancer. Diet has not been the best, + weight noted. Asthma is well controlled at this point however she anticipates some problems in the fall as symptoms are seasonal.  Wt Readings from Last 3 Encounters:  11/03/21 212 lb 8 oz (96.4 kg)  07/05/21 202 lb 6 oz (91.8 kg)  03/28/21 199 lb (90.3 kg)     Review of Systems See above   Past Medical History:  Diagnosis Date   Allergies    Anxiety    Asthma    Diabetes (HCC)    Diverticulosis    cscope 03/2014   GERD (gastroesophageal reflux disease)    Hyperlipidemia    Vitamin D deficiency     Past Surgical History:  Procedure Laterality Date   BREAST BIOPSY Left 09/14/2005   CESAREAN SECTION  1994    Current Outpatient Medications  Medication Instructions   albuterol (PROVENTIL) (2.5 MG/3ML) 0.083% nebulizer solution 3 mLs, Inhalation, Every 8 hours PRN   albuterol (VENTOLIN HFA) 108 (90 Base) MCG/ACT inhaler 2 puffs, Inhalation, Every 4 hours PRN   atorvastatin (LIPITOR) 40 mg, Oral, Daily at bedtime   BREO ELLIPTA 100-25 MCG/ACT AEPB 1 puff, Inhalation, Daily   cholecalciferol (VITAMIN D3) 1,000 Units, Oral, Daily   cyanocobalamin (VITAMIN B12) 1,000 mcg, Oral, Daily   ECHINACEA PO 1 tablet, Oral, Daily   esomeprazole (NEXIUM) 20 mg, Oral, Daily   Multiple Vitamin (MULTIVITAMIN) tablet 1 tablet, Oral, Daily   OVER THE COUNTER MEDICATION 1 capsule, Oral, Daily, tumeric   vitamin C 1,000 mg, Oral, Daily   zinc gluconate 50 mg, Oral, Daily       Objective:   Physical Exam BP 124/78   Pulse 75   Temp 98 F (36.7 C) (Oral)   Resp 18   Ht 5\' 4"  (1.626 m)   Wt 212 lb 8 oz (96.4 kg)   SpO2 98%   BMI 36.48 kg/m  General:   Well developed, NAD, BMI noted. HEENT:  Normocephalic .  Face symmetric, atraumatic Lungs:  CTA B Normal respiratory effort, no intercostal retractions, no accessory muscle use. Heart: RRR,  no murmur.  Lower extremities: no pretibial edema bilaterally  Skin: Not pale. Not jaundice Neurologic:  alert & oriented X3.  Speech normal, gait appropriate for age and unassisted Psych--  Cognition and judgment appear intact.  Cooperative with normal attention span and concentration.  Behavior appropriate. No anxious or depressed appearing.      Assessment    Assessment (new patient 01/2021, referred by her husband) Diabetes dx 02-2020  Hyperlipidemia Asthma onset 2018 , never smoke, ER visit x 1 12-2020  GERD (EGD 04/02/2014, normal, BX normal). Vit d  Menopausal  FH CAD, M age 48  PLAN DM: Diet controlled, + weight gain, diet has not been very good lately.  She remains active and takes a walk daily.  We talk about the importance of a structured- healthy diet. will check A1c.  Start medications only if  a1c  is much worse otherwise work on diet and follow-up in few months. Hyperlipidemia: Controlled on Lipitor. Asthma: Symptoms are seasonal, typically in the fall, use inhalers as needed, rec to  try to avoid known triggers. Stress: Patient's mother was Dx  with breast cancer, fortunately she finished all treatments and she is feeling well. Preventive care: Rec flu and COVID boosters. RTC CPX 02-2022

## 2021-11-24 ENCOUNTER — Other Ambulatory Visit: Payer: Self-pay | Admitting: Internal Medicine

## 2021-11-24 DIAGNOSIS — Z1231 Encounter for screening mammogram for malignant neoplasm of breast: Secondary | ICD-10-CM

## 2021-12-22 ENCOUNTER — Ambulatory Visit
Admission: RE | Admit: 2021-12-22 | Discharge: 2021-12-22 | Disposition: A | Discharge status: Home / Self Care | Payer: BC Managed Care – PPO | Source: Ambulatory Visit | Attending: Internal Medicine | Admitting: Internal Medicine

## 2021-12-22 DIAGNOSIS — Z1231 Encounter for screening mammogram for malignant neoplasm of breast: Secondary | ICD-10-CM

## 2022-03-08 ENCOUNTER — Encounter: Payer: Self-pay | Admitting: Internal Medicine

## 2022-03-08 ENCOUNTER — Ambulatory Visit (INDEPENDENT_AMBULATORY_CARE_PROVIDER_SITE_OTHER): Payer: BC Managed Care – PPO | Admitting: Internal Medicine

## 2022-03-08 VITALS — BP 126/72 | HR 71 | Temp 98.0°F | Resp 16 | Ht 64.0 in | Wt 209.2 lb

## 2022-03-08 DIAGNOSIS — Z0000 Encounter for general adult medical examination without abnormal findings: Secondary | ICD-10-CM | POA: Diagnosis not present

## 2022-03-08 DIAGNOSIS — E785 Hyperlipidemia, unspecified: Secondary | ICD-10-CM | POA: Diagnosis not present

## 2022-03-08 DIAGNOSIS — Z780 Asymptomatic menopausal state: Secondary | ICD-10-CM

## 2022-03-08 DIAGNOSIS — E1169 Type 2 diabetes mellitus with other specified complication: Secondary | ICD-10-CM | POA: Diagnosis not present

## 2022-03-08 LAB — COMPREHENSIVE METABOLIC PANEL
ALT: 18 U/L (ref 0–35)
AST: 24 U/L (ref 0–37)
Albumin: 4.2 g/dL (ref 3.5–5.2)
Alkaline Phosphatase: 61 U/L (ref 39–117)
BUN: 7 mg/dL (ref 6–23)
CO2: 28 mEq/L (ref 19–32)
Calcium: 9.4 mg/dL (ref 8.4–10.5)
Chloride: 104 mEq/L (ref 96–112)
Creatinine, Ser: 0.79 mg/dL (ref 0.40–1.20)
GFR: 79.94 mL/min (ref >60.00)
Glucose, Bld: 81 mg/dL (ref 70–99)
Potassium: 3.9 mEq/L (ref 3.5–5.1)
Sodium: 141 mEq/L (ref 135–145)
Total Bilirubin: 0.6 mg/dL (ref 0.2–1.2)
Total Protein: 7.3 g/dL (ref 6.0–8.3)

## 2022-03-08 LAB — CBC WITH DIFFERENTIAL/PLATELET
Basophils Absolute: 0 10*3/uL (ref 0.0–0.1)
Basophils Relative: 0.6 % (ref 0.0–3.0)
Eosinophils Absolute: 0.1 10*3/uL (ref 0.0–0.7)
Eosinophils Relative: 3.4 % (ref 0.0–5.0)
HCT: 38 % (ref 36.0–46.0)
Hemoglobin: 12.9 g/dL (ref 12.0–15.0)
Lymphocytes Relative: 46.3 % — ABNORMAL HIGH (ref 12.0–46.0)
Lymphs Abs: 1.7 10*3/uL (ref 0.7–4.0)
MCHC: 34 g/dL (ref 30.0–36.0)
MCV: 89 fl (ref 78.0–100.0)
Monocytes Absolute: 0.4 10*3/uL (ref 0.1–1.0)
Monocytes Relative: 9.7 % (ref 3.0–12.0)
Neutro Abs: 1.5 10*3/uL (ref 1.4–7.7)
Neutrophils Relative %: 40 % — ABNORMAL LOW (ref 43.0–77.0)
Platelets: 268 10*3/uL (ref 150.0–400.0)
RBC: 4.28 Mil/uL (ref 3.87–5.11)
RDW: 15 % (ref 11.5–15.5)
WBC: 3.7 10*3/uL — ABNORMAL LOW (ref 4.0–10.5)

## 2022-03-08 LAB — MICROALBUMIN / CREATININE URINE RATIO
Creatinine,U: 163.1 mg/dL
Microalb Creat Ratio: 12.1 mg/g (ref 0.0–30.0)
Microalb, Ur: 19.8 mg/dL — ABNORMAL HIGH (ref 0.0–1.9)

## 2022-03-08 LAB — LIPID PANEL
Cholesterol: 205 mg/dL — ABNORMAL HIGH (ref 0–200)
HDL: 63.9 mg/dL (ref >39.00)
LDL Cholesterol: 120 mg/dL — ABNORMAL HIGH (ref 0–99)
NonHDL: 141.5
Total CHOL/HDL Ratio: 3
Triglycerides: 106 mg/dL (ref 0.0–149.0)
VLDL: 21.2 mg/dL (ref 0.0–40.0)

## 2022-03-08 LAB — HEMOGLOBIN A1C: Hgb A1c MFr Bld: 6.5 % (ref 4.6–6.5)

## 2022-03-08 LAB — TSH: TSH: 1.58 u[IU]/mL (ref 0.35–5.50)

## 2022-03-08 NOTE — Patient Instructions (Addendum)
Use a carpal tunnel syndrome splint at nighttime on the left hand.  If you are not improving let me know  Vaccines I recommend:  Covid booster RSV vaccine     GO TO THE LAB : Get the blood work     [place], [place] Come back for checkup in 6 months  STOP BY THE FIRST FLOOR: Schedule a bone density test   Per our records you are due for your diabetic eye exam. Please contact your eye doctor to schedule an appointment. Please have them send copies of your office visit notes to [place]. Our fax number is [phone]. If you need a referral to an eye doctor please let us know.     "[place] of attorney" ,  "Living will" (Advance care planning documents)  If you already have a living will or healthcare power of attorney, is recommended you bring the copy to be scanned in your chart.   The document will be available to all the doctors you see in the system.  Advance care planning is a process that supports adults in  understanding and sharing their preferences regarding future medical care.  The patient's preferences are recorded in documents called Advance Directives and the can be modified at any time while the patient is in full mental capacity.   If you don't have one, please consider create one.      More information at: [email or web address]

## 2022-03-08 NOTE — Progress Notes (Signed)
Subjective:    Patient ID: Deborah Fuller, female    DOB: [birth year], 62 y.o.   MRN: [id number]  DOS:  03/08/2022 Type of visit - description: cpx  For CPX. In general feeling well. Has reported on and off trigger phenomenon in some of her right fingers. Also for few weeks has noted numbness at the left hand, mostly around the thumb but here lately the numbness has spread to all fingers. Denies neck, elbow or wrist pain.   Review of Systems  Other than above, a 14 point review of systems is negative     Past Medical History:  Diagnosis Date   Allergies    Anxiety    Asthma    Diabetes ([place])    Diverticulosis    cscope 03/2014   GERD (gastroesophageal reflux disease)    Hyperlipidemia    Vitamin D deficiency     Past Surgical History:  Procedure Laterality Date   BREAST BIOPSY Left 09/14/2005   CESAREAN SECTION  1994    Current Outpatient Medications  Medication Instructions   albuterol (PROVENTIL) (2.5 MG/3ML) 0.083% nebulizer solution 3 mLs, Inhalation, Every 8 hours PRN   albuterol (VENTOLIN HFA) 108 (90 Base) MCG/ACT inhaler 2 puffs, Inhalation, Every 4 hours PRN   atorvastatin (LIPITOR) 40 mg, Oral, Daily at bedtime   BREO ELLIPTA 100-25 MCG/ACT AEPB 1 puff, Inhalation, Daily   cholecalciferol (VITAMIN D3) 1,000 Units, Oral, Daily   cyanocobalamin (VITAMIN B12) 1,000 mcg, Oral, Daily   ECHINACEA PO 1 tablet, Oral, Daily   esomeprazole (NEXIUM) 20 mg, Oral, Daily   Multiple Vitamin (MULTIVITAMIN) tablet 1 tablet, Oral, Daily   OVER THE COUNTER MEDICATION 1 capsule, Oral, Daily, tumeric   vitamin C 1,000 mg, Oral, Daily   zinc gluconate 50 mg, Oral, Daily       Objective:   Physical Exam BP 126/72   Pulse 71   Temp 98 F (36.7 C) (Oral)   Resp 16   Ht 5\' 4"  (1.626 m)   Wt 209 lb 4 oz (94.9 kg)   SpO2 95%   BMI 35.92 kg/m  General: Well developed, NAD, BMI noted Neck: No  thyromegaly  HEENT:  Normocephalic . Face symmetric, atraumatic Lungs:   CTA B Normal respiratory effort, no intercostal retractions, no accessory muscle use. Heart: RRR,  no murmur.  Abdomen:  Not distended, soft, non-tender. No rebound or rigidity. Hands: Joints and wrists without synovitis. Range of motion of the wrist is normal. Right hand: + Tinel's sign, negative Phalen's sign Very subtle trigger phenomena at the right thumb Lower extremities: no pretibial edema bilaterally  Skin: Exposed areas without rash. Not pale. Not jaundice Neurologic:  alert & oriented X3.  Speech normal, gait appropriate for age and unassisted Strength symmetric and appropriate for age.  Psych: Cognition and judgment appear intact.  Cooperative with normal attention span and concentration.  Behavior appropriate. No anxious or depressed appearing.     Assessment     Assessment (new patient 01/2021, referred by her husband) Diabetes dx 02-2020  Hyperlipidemia Asthma onset 2018 , never smoke, ER visit x 1 12-2020  GERD (EGD 04/02/2014, normal, BX normal). Vit d  Menopausal 62 y/o  PLAN Here for CPX DM: Diet controlled, doing well with decrease carbohydrate intakes.  Checking labs.  Encouraged routine exercise. Hyperlipidemia: On atorvastatin.  Checking labs Asthma: Symptoms are seasonal, currently asymptomatic. Vitamin D: On supplements, last levels very good.  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 RTC 6 months

## 2022-03-09 ENCOUNTER — Ambulatory Visit (HOSPITAL_BASED_OUTPATIENT_CLINIC_OR_DEPARTMENT_OTHER)
Admission: RE | Admit: 2022-03-09 | Discharge: 2022-03-09 | Disposition: A | Discharge status: Home / Self Care | Payer: BC Managed Care – PPO | Source: Ambulatory Visit | Attending: Internal Medicine | Admitting: Internal Medicine

## 2022-03-09 ENCOUNTER — Encounter: Payer: Self-pay | Admitting: Internal Medicine

## 2022-03-09 DIAGNOSIS — Z780 Asymptomatic menopausal state: Secondary | ICD-10-CM | POA: Diagnosis present

## 2022-03-09 NOTE — Assessment & Plan Note (Signed)
Here for CPX DM: Diet controlled, doing well with decrease carbohydrate intakes.  Checking labs.  Encouraged routine exercise. Hyperlipidemia: On atorvastatin.  Checking labs Asthma: Symptoms are seasonal, currently asymptomatic. Vitamin D: On supplements, last levels very good.  No change Trigger phenomena: Right hand, recommend observation, if they become bothersome she will let me know CTS, L hand: Suspect symptoms related to CTS, will check a TSH, encouraged the use of night splint. RTC 6 months

## 2022-03-09 NOTE — Assessment & Plan Note (Signed)
-  Tdap 2016 -PNM 20: December 2022 - RSV discussed -S/p 2 shingrix:   - COVID vaccine: Booster recommended - Had a flu shot - CCS: Colonoscopy 04/02/2014: Normal, 10 years.  (She also had Cologuards x2) - Female care: PAP 06-2021 ([facility]) +FH breast Ca (M)  MMG 12-2021 -Diet exercise discussed -Labs:  CMP FLP CBC A1c TSH -Healthcare POA: See AVS

## 2022-05-02 ENCOUNTER — Encounter: Payer: Self-pay | Admitting: Internal Medicine

## 2022-05-02 ENCOUNTER — Ambulatory Visit: Payer: BC Managed Care – PPO | Admitting: Internal Medicine

## 2022-05-02 VITALS — BP 126/76 | HR 106 | Temp 97.7°F | Resp 18 | Ht 64.0 in | Wt 212.2 lb

## 2022-05-02 DIAGNOSIS — G5602 Carpal tunnel syndrome, left upper limb: Secondary | ICD-10-CM

## 2022-05-02 DIAGNOSIS — M6530 Trigger finger, unspecified finger: Secondary | ICD-10-CM | POA: Diagnosis not present

## 2022-05-02 DIAGNOSIS — R200 Anesthesia of skin: Secondary | ICD-10-CM | POA: Diagnosis not present

## 2022-05-02 DIAGNOSIS — R202 Paresthesia of skin: Secondary | ICD-10-CM | POA: Diagnosis not present

## 2022-05-02 NOTE — Patient Instructions (Addendum)
Keep using the brace We are referring you to a orthopedist  Per our records you are due for your diabetic eye exam. Please contact your eye doctor to schedule an appointment. Please have them send copies of your office visit notes to [place]. Our fax number is [phone]. If you need a referral to an eye doctor please let us know.

## 2022-05-02 NOTE — Progress Notes (Unsigned)
   Subjective:    Patient ID: Deborah Fuller, female    DOB: 1960/12/22, 62 y.o.   MRN: [id number]  DOS:  05/02/2022 Type of visit - description: Acute See LOV, she has issues with right trigger finger, that is better. She is here because she continue with numbness and a "cold feeling" at the left second and third fingers. She has been using CTS brace at night without much help. Symptoms are worse at night particularly if her hand is resting down.   Review of Systems See above   Past Medical History:  Diagnosis Date   Allergies    Anxiety    Asthma    Diabetes ([place])    Diverticulosis    cscope 03/2014   GERD (gastroesophageal reflux disease)    Hyperlipidemia    Vitamin D deficiency     Past Surgical History:  Procedure Laterality Date   BREAST BIOPSY Left 09/14/2005   CESAREAN SECTION  1994    Current Outpatient Medications  Medication Instructions   albuterol (PROVENTIL) (2.5 MG/3ML) 0.083% nebulizer solution 3 mLs, Inhalation, Every 8 hours PRN   albuterol (VENTOLIN HFA) 108 (90 Base) MCG/ACT inhaler 2 puffs, Inhalation, Every 4 hours PRN   atorvastatin (LIPITOR) 40 mg, Oral, Daily at bedtime   BREO ELLIPTA 100-25 MCG/ACT AEPB 1 puff, Inhalation, Daily   cholecalciferol 1,000 Units, Oral, Daily   cyanocobalamin (VITAMIN B12) 1,000 mcg, Oral, Daily   ECHINACEA PO 1 tablet, Oral, Daily   esomeprazole (NEXIUM) 20 mg, Oral, Daily   Multiple Vitamin (MULTIVITAMIN) tablet 1 tablet, Oral, Daily   OVER THE COUNTER MEDICATION 1 capsule, Oral, Daily, tumeric   vitamin C 1,000 mg, Oral, Daily   zinc gluconate 50 mg, Oral, Daily       Objective:   Physical Exam BP 126/76   Pulse (!) 106   Temp 97.7 F (36.5 C) (Oral)   Resp 18   Ht 5\' 4"  (1.626 m)   Wt 212 lb 4 oz (96.3 kg)   SpO2 97%   BMI 36.43 kg/m  General:   Well developed, NAD, BMI noted. HEENT:  Normocephalic . Face symmetric, atraumatic Upper extremities: Hands and wrist with no synovitis Thenar  muscles smaller on the left. Skin: Not pale. Not jaundice Neurologic:  alert & oriented X3.  Speech normal, gait appropriate for age and unassisted Psych--  Cognition and judgment appear intact.  Cooperative with normal attention span and concentration.  Behavior appropriate. No anxious or depressed appearing.      Assessment      Assessment (new patient 01/2021, referred by her husband) Diabetes dx 02-2020  Hyperlipidemia Asthma onset 2018 , never smoke, ER visit x 1 12-2020  GERD (EGD 04/02/2014, normal, BX normal). Vit d  Menopausal 62 y/o  PLAN CTS L: Not better, some thenar atrophy noted on the left, plan: Refer to Ortho Trigger phenomena, R: Resolved DM: Had a eye exam, will bring notes.

## 2022-05-04 ENCOUNTER — Telehealth: Payer: Self-pay | Admitting: Internal Medicine

## 2022-05-04 MED ORDER — ATORVASTATIN CALCIUM 40 MG PO TABS: 40.0000 mg | ORAL_TABLET | Freq: Every day | ORAL | 1 refills | Status: DC

## 2022-05-04 NOTE — Assessment & Plan Note (Signed)
CTS L: Not better, some thenar atrophy noted on the left, plan: Refer to Ortho Trigger phenomena, R: Resolved DM: Had a eye exam, will bring notes.

## 2022-05-04 NOTE — Telephone Encounter (Signed)
Rx sent 

## 2022-05-04 NOTE — Telephone Encounter (Signed)
Medication: atorvastatin (LIPITOR) 40 MG tablet  Has the patient contacted their pharmacy? No.   Preferred Pharmacy:  [id number] [id number] [address] [id number]: [phone] Fax: [phone]

## 2022-05-18 ENCOUNTER — Encounter: Payer: Self-pay | Admitting: Internal Medicine

## 2022-06-20 ENCOUNTER — Encounter (HOSPITAL_BASED_OUTPATIENT_CLINIC_OR_DEPARTMENT_OTHER): Payer: Self-pay | Admitting: Pediatrics

## 2022-06-20 ENCOUNTER — Emergency Department (HOSPITAL_BASED_OUTPATIENT_CLINIC_OR_DEPARTMENT_OTHER)
Admission: EM | Admit: 2022-06-20 | Discharge: 2022-06-20 | Disposition: A | Discharge status: Home / Self Care | Payer: BC Managed Care – PPO | Attending: Emergency Medicine | Admitting: Emergency Medicine

## 2022-06-20 ENCOUNTER — Other Ambulatory Visit: Payer: Self-pay

## 2022-06-20 ENCOUNTER — Telehealth: Payer: Self-pay | Admitting: *Deleted

## 2022-06-20 ENCOUNTER — Emergency Department (HOSPITAL_BASED_OUTPATIENT_CLINIC_OR_DEPARTMENT_OTHER): Payer: BC Managed Care – PPO

## 2022-06-20 DIAGNOSIS — J45901 Unspecified asthma with (acute) exacerbation: Secondary | ICD-10-CM | POA: Diagnosis present

## 2022-06-20 DIAGNOSIS — J4541 Moderate persistent asthma with (acute) exacerbation: Secondary | ICD-10-CM | POA: Diagnosis not present

## 2022-06-20 LAB — BASIC METABOLIC PANEL
Anion gap: 8 (ref 5–15)
BUN: 6 mg/dL — ABNORMAL LOW (ref 8–23)
CO2: 26 mmol/L (ref 22–32)
Calcium: 8.9 mg/dL (ref 8.9–10.3)
Chloride: 105 mmol/L (ref 98–111)
Creatinine, Ser: 0.85 mg/dL (ref 0.44–1.00)
GFR, Estimated: >60 mL/min (ref >60)
Glucose, Bld: 117 mg/dL — ABNORMAL HIGH (ref 70–99)
Potassium: 4.1 mmol/L (ref 3.5–5.1)
Sodium: 139 mmol/L (ref 135–145)

## 2022-06-20 LAB — CBC
HCT: 37.1 % (ref 36.0–46.0)
Hemoglobin: 12.2 g/dL (ref 12.0–15.0)
MCH: 29.6 pg (ref 26.0–34.0)
MCHC: 32.9 g/dL (ref 30.0–36.0)
MCV: 90 fL (ref 80.0–100.0)
Platelets: 250 10*3/uL (ref 150–400)
RBC: 4.12 MIL/uL (ref 3.87–5.11)
RDW: 14.2 % (ref 11.5–15.5)
WBC: 5.2 10*3/uL (ref 4.0–10.5)
nRBC: 0 % (ref 0.0–0.2)

## 2022-06-20 MED ORDER — MAGNESIUM SULFATE 2 GM/50ML IV SOLN
2.0000 g | Freq: Once | INTRAVENOUS | Status: AC
  Administered 2022-06-20: 2 g via INTRAVENOUS
  Filled 2022-06-20: qty 50

## 2022-06-20 MED ORDER — IPRATROPIUM-ALBUTEROL 0.5-2.5 (3) MG/3ML IN SOLN
3.0000 mL | Freq: Once | RESPIRATORY_TRACT | Status: AC
  Administered 2022-06-20: 3 mL via RESPIRATORY_TRACT
  Filled 2022-06-20: qty 3

## 2022-06-20 MED ORDER — METHYLPREDNISOLONE 4 MG PO TBPK: ORAL_TABLET | ORAL | 0 refills | Status: DC

## 2022-06-20 MED ORDER — ONDANSETRON 4 MG PO TBDP: 4.0000 mg | ORAL_TABLET | Freq: Three times a day (TID) | ORAL | 0 refills | Status: DC | PRN

## 2022-06-20 MED ORDER — IPRATROPIUM-ALBUTEROL 0.5-2.5 (3) MG/3ML IN SOLN: 3.0000 mL | Freq: Once | RESPIRATORY_TRACT | Status: AC

## 2022-06-20 MED ORDER — ONDANSETRON HCL 4 MG/2ML IJ SOLN
4.0000 mg | Freq: Once | INTRAMUSCULAR | Status: AC | PRN
  Administered 2022-06-20: 4 mg via INTRAVENOUS
  Filled 2022-06-20: qty 2

## 2022-06-20 MED ORDER — METHYLPREDNISOLONE SODIUM SUCC 125 MG IJ SOLR
125.0000 mg | Freq: Once | INTRAMUSCULAR | Status: AC
  Administered 2022-06-20: 125 mg via INTRAVENOUS
  Filled 2022-06-20: qty 2

## 2022-06-20 MED ORDER — IPRATROPIUM-ALBUTEROL 0.5-2.5 (3) MG/3ML IN SOLN
RESPIRATORY_TRACT | Status: AC
  Administered 2022-06-20: 6 mL via RESPIRATORY_TRACT
  Filled 2022-06-20: qty 6

## 2022-06-20 NOTE — Telephone Encounter (Signed)
Left message to make sure patient goes to ER.

## 2022-06-20 NOTE — Telephone Encounter (Signed)
Who Is Calling Patient / Member / Family / Caregiver Call Type Triage / Clinical Relationship To Patient Self Return Phone Number [phone] (Primary) Chief Complaint BREATHING - shortness of breath or sounds breathless Reason for Call Symptomatic / Request for Health Information Initial Comment Caller currently having an asthma attack and having shortness of breath Translation No Nurse Assessment Nurse: Annye English, RN, Angelique Blonder Date/Time (Eastern Time): 06/20/2022 8:10:08 AM Confirm and document reason for call. If symptomatic, describe symptoms. ---Caller currently having an asthma attack and having shortness of breath  Time) Disposition Final User 06/20/2022 8:07:54 AM Send to Urgent Michela Pitcher, Lanette 06/20/2022 8:13:57 AM Go to ED Now Yes Carmon, RN, Angelique Blonder PLEASE NOTE: All timestamps contained within this report are represented as [place] Standard Time.  Call Id: [id number] Final Disposition 06/20/2022 8:13:57 AM Go to ED Now Yes Carmon, RN, Leighton Ruff Disagree/Comply Comply Caller Understands Yes PreDisposition Call Doctor Care Advice Given Per Guideline GO TO ED NOW: ANOTHER ADULT SHOULD DRIVE: * It is better and safer if another adult drives instead of you. ASTHMA ATTACK - TREATMENT - QUICK-RELIEF MEDICINE: * During an ASTHMA ATTACK use your SHORT-ACTING QUICKRELIEF INHALER (4 puffs, such as ALBUTEROL) or nebulizer up to 3 times, every 20 minutes as needed. Take 4 to 6 puffs of your quick-relief ALBUTEROL (SALBUTAMOL) INHALER right now. Repeat every 15 to 20 minutes. BRING MEDICINES: * Please bring a list of your current medicines when you go to see the doctor. CARE ADVICE given per Asthma Attack (Adult) guideline. Referrals [facility] - ED

## 2022-06-20 NOTE — ED Provider Notes (Signed)
[facility] Provider Note   CSN: [id number] Arrival date & time: 06/20/22  4098     History  Chief Complaint  Patient presents with   Shortness of Breath    Deborah Fuller is a 62 y.o. female.  Patient with history of asthma presents today with complaints of asthma exacerbation.  She states that 4 days ago she started having cough and congestion.  States that her congestion has resolved, however her cough and wheezing have been persistent.  She originally was able to manage her symptoms with her home inhalers, however today she feels like she is wheezing more than previously.  She tried 4 rounds of her home nebulizer treatments with minimal improvement.  She denies fevers, chills, chest pain, nausea, vomiting.  No known sick contacts.  The history is provided by the patient. No language interpreter was used.  Shortness of Breath Associated symptoms: wheezing        Home Medications Prior to Admission medications   Medication Sig Start Date End Date Taking? Authorizing Provider  albuterol (PROVENTIL) (2.5 MG/3ML) 0.083% nebulizer solution Inhale 3 mLs into the lungs every 8 (eight) hours as needed for wheezing or shortness of breath. 10/19/21   Wanda Plump, MD  albuterol (VENTOLIN HFA) 108 (90 Base) MCG/ACT inhaler Inhale 2 puffs into the lungs every 4 (four) hours as needed for wheezing or shortness of breath. 10/19/21   Wanda Plump, MD  Ascorbic Acid (VITAMIN C) 1000 MG tablet Take 1,000 mg by mouth daily.    [provider]  atorvastatin (LIPITOR) 40 MG tablet Take 1 tablet (40 mg total) by mouth at bedtime. 05/04/22   Paz, Nolon Rod, MD  BREO ELLIPTA 100-25 MCG/ACT AEPB Inhale 1 puff into the lungs daily. 12/27/20   [provider]  cholecalciferol (VITAMIN D) 25 MCG (1000 UNIT) tablet Take 1,000 Units by mouth daily.    [provider]  ECHINACEA PO Take 1 tablet by mouth daily.    [provider]   esomeprazole (NEXIUM) 20 MG capsule Take 20 mg by mouth daily.    [provider]  Multiple Vitamin (MULTIVITAMIN) tablet Take 1 tablet by mouth daily.    [provider]  OVER THE COUNTER MEDICATION Take 1 capsule by mouth daily. tumeric    [provider]  vitamin B-12 (CYANOCOBALAMIN) 1000 MCG tablet Take 1,000 mcg by mouth daily.    [provider]  zinc gluconate 50 MG tablet Take 50 mg by mouth daily.    [provider]      Allergies    Shellfish allergy and Prednisone    Review of Systems   Review of Systems  Respiratory:  Positive for shortness of breath and wheezing.   All other systems reviewed and are negative.   Physical Exam Updated Vital Signs BP 134/76 (BP Location: Left Arm)   Pulse 95   Temp 98.2 F (36.8 C) (Oral)   Resp (!) 24   Ht 5\' 4"  (1.626 m)   Wt 90.7 kg   SpO2 93%   BMI 34.33 kg/m  Physical Exam Vitals and nursing note reviewed.  Constitutional:      General: She is not in acute distress.    Appearance: Normal appearance. She is normal weight. She is not ill-appearing, toxic-appearing or diaphoretic.  HENT:     Head: Normocephalic and atraumatic.  Cardiovascular:     Rate and Rhythm: Normal rate and regular rhythm.  Heart sounds: Normal heart sounds.  Pulmonary:     Effort: Pulmonary effort is normal. No respiratory distress.     Breath sounds: Wheezing present.  Abdominal:     Palpations: Abdomen is soft.     Tenderness: There is no abdominal tenderness.  Musculoskeletal:        General: Normal range of motion.     Cervical back: Normal range of motion.  Skin:    General: Skin is warm and dry.  Neurological:     General: No focal deficit present.     Mental Status: She is alert.  Psychiatric:        Mood and Affect: Mood normal.        Behavior: Behavior normal.     ED Results / Procedures / Treatments   Labs (all labs ordered are listed, but only abnormal results are  displayed) Labs Reviewed  CBC  BASIC METABOLIC PANEL    EKG EKG Interpretation  Date/Time:  Tuesday June 20 2022 09:58:29 EDT Ventricular Rate:  92 PR Interval:  145 QRS Duration: 97 QT Interval:  372 QTC Calculation: 461 R Axis:   15 Text Interpretation: Sinus rhythm Low voltage, precordial leads No significant change since last tracing Confirmed by Alvira Monday ([id number]) on 06/20/2022 11:38:15 AM  Radiology DG Chest Portable 1 View  Result Date: 06/20/2022 CLINICAL DATA:  Cough. EXAM: PORTABLE CHEST 1 VIEW COMPARISON:  Chest x-ray dated January 13, 2021. FINDINGS: The heart size and mediastinal contours are within normal limits. No focal consolidation, pleural effusion, or pneumothorax. The visualized skeletal structures are unremarkable. IMPRESSION: No active disease. Electronically Signed   By: Obie Dredge M.D.   On: 06/20/2022 09:49    Procedures Procedures    Medications Ordered in ED Medications  magnesium sulfate IVPB 2 g 50 mL (has no administration in time range)  methylPREDNISolone sodium succinate (SOLU-MEDROL) 125 mg/2 mL injection 125 mg (has no administration in time range)  ondansetron (ZOFRAN) injection 4 mg (has no administration in time range)  ipratropium-albuterol (DUONEB) 0.5-2.5 (3) MG/3ML nebulizer solution 3 mL (has no administration in time range)  ipratropium-albuterol (DUONEB) 0.5-2.5 (3) MG/3ML nebulizer solution 3 mL (6 mLs Nebulization Given 06/20/22 0907)    ED Course/ Medical Decision Making/ A&P                             Medical Decision Making Amount and/or Complexity of Data Reviewed Labs: ordered. Radiology: ordered.  Risk Prescription drug management.   This patient is a 62 y.o. female who presents to the ED for concern of wheezing/asthma exacerbation, this involves an extensive number of treatment options, and is a complaint that carries with it a high risk of complications and morbidity. The emergent differential  diagnosis prior to evaluation includes, but is not limited to,  asthma, URI . This is not an exhaustive differential.   Past Medical History / Co-morbidities / Social History: Hx asthma  Physical Exam: Physical exam performed. The pertinent findings include: wheezing  Lab Tests: I ordered, and personally interpreted labs.  The pertinent results include: No acute laboratory findings   Imaging Studies: I ordered imaging studies including CXR. I independently visualized and interpreted imaging which showed NAD. I agree with the radiologist interpretation.   Cardiac Monitoring:  The patient was maintained on a cardiac monitor.  My attending physician Dr. Dalene Seltzer viewed and interpreted the cardiac monitored which showed an underlying rhythm of: sinus rhythm, no STEMI.  I agree with this interpretation.   Medications: I ordered medication including duonebs, steroids, magnesium  for wheezing/asthma. Reevaluation of the patient after these medicines showed that the patient resolved. I have reviewed the patients home medicines and have made adjustments as needed.   Disposition:  Patient presents today with complaints of wheezing, concern for asthma exacerbation.  Initially found to be wheezing throughout, given 3 rounds of DuoNebs, steroids, and magnesium with significant improvement.  Upon reassessment she is resting comfortably in bed her wheezing has resolved and she feels significantly improved.  We did ambulate her on room air and she was able to walk throughout the hallway without any desaturation, tachypnea, or tachycardia.  Discussed dispo with patient who states she feels like she can manage her symptoms at home. Evaluation and diagnostic testing in the emergency department does not suggest an emergent condition requiring admission or immediate intervention beyond what has been performed at this time.  Plan for discharge a course of steroids as well as Zofran as she states that steroids make  her nauseous.  Also recommend close PCP follow-up.  Patient is understanding and amenable with plan, educated on red flag symptoms that would prompt immediate return.  Patient discharged in stable condition.   Final Clinical Impression(s) / ED Diagnoses Final diagnoses:  Moderate asthma with exacerbation, unspecified whether persistent    Rx / DC Orders ED Discharge Orders          Ordered    methylPREDNISolone (MEDROL DOSEPAK) 4 MG TBPK tablet        06/20/22 1204    ondansetron (ZOFRAN-ODT) 4 MG disintegrating tablet  Every 8 hours PRN        06/20/22 1204          An After Visit Summary was printed and given to the patient.     Vear Clock 06/20/22 1212    Alvira Monday, MD 06/21/22 [id number]

## 2022-06-20 NOTE — ED Notes (Signed)
RT ambulated with pt. Pt's O2 saturation remained between 93-94%. Pt seemed comfortable during ambulation.

## 2022-06-20 NOTE — Telephone Encounter (Signed)
Pt did go go over to ED.

## 2022-06-20 NOTE — Discharge Instructions (Signed)
As we discussed, your workup in the ER today was reassuring for acute findings.  Given that after medications given to you in the emergency department this evening you are feeling better, it is reasonable for you to go home.  I have given you a prescription for a short course of steroids for you to take as prescribed in its entirety for continued management of your asthma.  I have also given you a prescription for Zofran to take for nausea and vomiting that you experience when you take steroids.  Please take these as prescribed as needed.  I recommend that you call your primary care doctor in the next few days to schedule an appointment for follow-up.  Return if development of any new or worsening symptoms.

## 2022-06-20 NOTE — ED Triage Notes (Signed)
C/O asthma exacerbation; stated had 4 puffs of inhaler this morning with minimum relief.   RT at bedside in Triage2, will initiate Duoneb,

## 2022-06-26 ENCOUNTER — Ambulatory Visit: Payer: BC Managed Care – PPO | Admitting: Medical

## 2022-07-06 HISTORY — PX: CARPAL TUNNEL RELEASE: SHX101

## 2022-07-28 ENCOUNTER — Ambulatory Visit: Payer: BC Managed Care – PPO | Admitting: Internal Medicine

## 2022-07-28 ENCOUNTER — Encounter: Payer: Self-pay | Admitting: Internal Medicine

## 2022-07-28 VITALS — BP 122/82 | HR 66 | Temp 98.4°F | Resp 16 | Ht 64.0 in | Wt 209.1 lb

## 2022-07-28 DIAGNOSIS — E1169 Type 2 diabetes mellitus with other specified complication: Secondary | ICD-10-CM

## 2022-07-28 DIAGNOSIS — E785 Hyperlipidemia, unspecified: Secondary | ICD-10-CM | POA: Diagnosis not present

## 2022-07-28 DIAGNOSIS — J4520 Mild intermittent asthma, uncomplicated: Secondary | ICD-10-CM

## 2022-07-28 LAB — LIPID PANEL
Cholesterol: 170 mg/dL (ref 0–200)
HDL: 73.3 mg/dL (ref >39.00)
LDL Cholesterol: 81 mg/dL (ref 0–99)
NonHDL: 96.37
Total CHOL/HDL Ratio: 2
Triglycerides: 77 mg/dL (ref 0.0–149.0)
VLDL: 15.4 mg/dL (ref 0.0–40.0)

## 2022-07-28 LAB — HEMOGLOBIN A1C: Hgb A1c MFr Bld: 6.4 % (ref 4.6–6.5)

## 2022-07-28 NOTE — Progress Notes (Unsigned)
Subjective:    Patient ID: Deborah Fuller, female    DOB: 19-Jun-1960, 62 y.o.   MRN: [id number]  DOS:  07/28/2022 Type of visit - description: ED f/u  Went to the ER 06/20/2022, reported cough, wheezing.  Did not get better after 4 rounds of home nebs.  Chest x-ray okay.  Was treated with prednisone, magnesium and nebulizations.  Responded well. Discharge on steroids.  Since then, she is completely asymptomatic. Denies any fever or chills.  No cough, no wheezing.  High cholesterol: Good compliance with medication. DM: Due for A1c.  Review of Systems See above   Past Medical History:  Diagnosis Date   Allergies    Anxiety    Asthma    Diabetes (HCC)    Diverticulosis    cscope 03/2014   GERD (gastroesophageal reflux disease)    Hyperlipidemia    Vitamin D deficiency     Past Surgical History:  Procedure Laterality Date   BREAST BIOPSY Left 09/14/2005   CARPAL TUNNEL RELEASE Left 07/06/2022   CESAREAN SECTION  1994    Current Outpatient Medications  Medication Instructions   albuterol (PROVENTIL) (2.5 MG/3ML) 0.083% nebulizer solution 3 mLs, Inhalation, Every 8 hours PRN   albuterol (VENTOLIN HFA) 108 (90 Base) MCG/ACT inhaler 2 puffs, Inhalation, Every 4 hours PRN   atorvastatin (LIPITOR) 40 mg, Oral, Daily at bedtime   BREO ELLIPTA 100-25 MCG/ACT AEPB 1 puff, Daily   cholecalciferol (VITAMIN D3) 1,000 Units, Oral, Daily   cyanocobalamin (VITAMIN B12) 1,000 mcg, Oral, Daily   ECHINACEA PO 1 tablet, Oral, Daily   esomeprazole (NEXIUM) 20 mg, Oral, Daily   methylPREDNISolone (MEDROL DOSEPAK) 4 MG TBPK tablet Take as directed on package   Multiple Vitamin (MULTIVITAMIN) tablet 1 tablet, Oral, Daily   ondansetron (ZOFRAN-ODT) 4 mg, Oral, Every 8 hours PRN   OVER THE COUNTER MEDICATION 1 capsule, Oral, Daily, tumeric   vitamin C 1,000 mg, Oral, Daily   zinc gluconate 50 mg, Oral, Daily       Objective:   Physical Exam BP 122/82   Pulse 66   Temp 98.4 F (36.9  C) (Oral)   Resp 16   Ht 5\' 4"  (1.626 m)   Wt 209 lb 2 oz (94.9 kg)   SpO2 94%   BMI 35.90 kg/m  General:   Well developed, NAD, BMI noted. HEENT:  Normocephalic . Face symmetric, atraumatic Lungs:  CTA B Normal respiratory effort, no intercostal retractions, no accessory muscle use. Heart: RRR,  no murmur.  Lower extremities: no pretibial edema bilaterally  Skin: Not pale. Not jaundice Neurologic:  alert & oriented X3.  Speech normal, gait appropriate for age and unassisted Psych--  Cognition and judgment appear intact.  Cooperative with normal attention span and concentration.  Behavior appropriate. No anxious or depressed appearing.      Assessment    Assessment (new patient 01/2021, referred by her husband) Diabetes dx 02-2020  Hyperlipidemia Asthma onset 2018 , never smoke, ER visit x 1 12-2020  GERD (EGD 04/02/2014, normal, BX normal). Vit d  Menopausal 62 y/o  PLAN Asthma: Typically well-controlled, went to the ER few weeks ago and is now back to normal.  Recommend to continue with Breo, albuterol as needed. She thinks that excessive pollen in the environment is what caused her asthma exacerbation. Plan:  -Continue present care - try to avoid excessive exposure to pollen -stay home when pollen counts are high. -Start Allegra and Flonase as soon as allergy symptoms increase  DM: Diet controlled, check A1c High cholesterol: On atorvastatin, last LDL not at goal, reports good compliance.  Check a fasting lipid profile today. Vaccine advice provided RTC 4 months   CTS L: Not better, some thenar atrophy noted on the left, plan: Refer to [facility], R: Resolved DM: Had a eye exam, will bring notes.

## 2022-07-28 NOTE — Patient Instructions (Addendum)
Continue Breo Ellipta daily and albuterol as needed  As soon as you feel that your allergies or asthma are getting worse start taking: Allegra 60 mg over-the-counter 1 tablet twice daily Flonase 2 sprays on each side of the nose daily.  Vaccines I recommend:  Covid booster RSV vaccine    GO TO THE LAB : Get the blood work     GO TO THE FRONT DESK, PLEASE SCHEDULE YOUR APPOINTMENTS Okay to cancel your appointment for next month and scheduled routine checkup in 4 months.        Per our records you are due for your diabetic eye exam. Please contact your eye doctor to schedule an appointment. Please have them send copies of your office visit notes to [place]. Our fax number is [phone]. If you need a referral to an eye doctor please let us know.

## 2022-07-30 NOTE — Assessment & Plan Note (Signed)
Asthma: Typically well-controlled, went to the ER few weeks ago and is now back to normal.  Recommend to continue with Breo, albuterol as needed. She thinks that excessive pollen in the environment is what caused her asthma exacerbation. Plan:  -Continue present care - try to avoid excessive exposure to pollen -stay home when pollen counts are high. -Start Allegra and Flonase as soon as allergy symptoms increase DM: Diet controlled, check A1c High cholesterol: On atorvastatin, last LDL not at goal, reports good compliance.  Check a fasting lipid profile today. Vaccine advice provided RTC 4 months

## 2022-09-08 ENCOUNTER — Ambulatory Visit: Payer: BC Managed Care – PPO | Admitting: Internal Medicine

## 2022-10-12 ENCOUNTER — Encounter: Payer: Self-pay | Admitting: Internal Medicine

## 2022-10-31 ENCOUNTER — Telehealth: Payer: Self-pay | Admitting: Internal Medicine

## 2022-10-31 MED ORDER — BREO ELLIPTA 100-25 MCG/ACT IN AEPB: 1.0000 | INHALATION_SPRAY | Freq: Every day | RESPIRATORY_TRACT | 5 refills | Status: DC

## 2022-10-31 NOTE — Addendum Note (Signed)
Addended byConrad Walton D on: 10/31/2022 03:08 PM   Modules accepted: Orders

## 2022-10-31 NOTE — Telephone Encounter (Signed)
Pt needs new prescription sent in for [facility]. Pt said her asthma flares up in the fall and her pharmacy needs a new rx sent to [facility] on [address]. Please call her to advise when complete

## 2022-10-31 NOTE — Telephone Encounter (Signed)
Rx sent 

## 2022-11-23 ENCOUNTER — Other Ambulatory Visit: Payer: Self-pay | Admitting: Internal Medicine

## 2022-11-23 DIAGNOSIS — Z1231 Encounter for screening mammogram for malignant neoplasm of breast: Secondary | ICD-10-CM

## 2022-11-27 ENCOUNTER — Telehealth: Payer: Self-pay | Admitting: Internal Medicine

## 2022-11-27 ENCOUNTER — Ambulatory Visit: Payer: BC Managed Care – PPO | Admitting: Internal Medicine

## 2022-11-27 NOTE — Telephone Encounter (Signed)
Initial Comment Caller states that she has an appointment today but she woke up with bad diarrhea. She believes she needs to cancel and get some advice. Translation No Nurse Assessment Nurse: Andrey Campanile, RN, Marylene Land Date/Time Deborah Fuller Time): 11/27/2022 8:43:43 AM Confirm and document reason for call. If symptomatic, describe symptoms. ---Caller states that she has an appointment today, she cancelled it. Has had diarrhea episodes every 30 minutes. No fever. Stomach cramps prior to having diarrhea episodes. Does the patient have any new or worsening symptoms? ---Yes Will a triage be completed? ---Yes Related visit to physician within the last 2 weeks? ---No Does the PT have any chronic conditions? (i.e. diabetes, asthma, this includes High risk factors for pregnancy, etc.) ---Yes List chronic conditions. ---Pre-Diabetes, Asthma Is this a behavioral health or substance abuse call? ---No Guidelines Guideline Title Affirmed Question Affirmed Notes Nurse Date/Time (Eastern Time) Diarrhea SEVERE diarrhea (e.g., 7 or more times / day more than normal) Andrey Campanile, RN, Marylene Land 11/27/2022 8:46:17 AM Disp. Time Deborah Fuller Time) Disposition Final User 11/27/2022 8:16:00 AM Send To RN Personal Thana Farr, RN, Ladona Ridgel 11/27/2022 8:21:19 AM Attempt made - message left Andrey Campanile, RN, Arlyss Gandy NOTE: All timestamps contained within this report are represented as [place] Standard Time. CONFIDENTIALTY NOTICE: This fax transmission is intended only for the addressee. It contains information that is legally privileged, confidential or otherwise protected from use or disclosure. If you are not the intended recipient, you are strictly prohibited from reviewing, disclosing, copying using or disseminating any of this information or taking any action in reliance on or regarding this information. If you have received this fax in error, please notify us immediately by telephone so that we can arrange for its return to [place].  Phone: [phone], Toll-Free: [phone], Fax: [phone] Page: 2 of 2 Call Id: [id number] Disp. Time Deborah Fuller Time) Disposition Final User 11/27/2022 8:30:16 AM Attempt made - message left Marisue Humble 11/27/2022 8:51:29 AM Home Care Yes Andrey Campanile, RN, Marylene Land Final Disposition 11/27/2022 8:51:29 AM Home Care Yes Andrey Campanile, RN, Rosalyn Charters Disagree/Comply Comply Caller Understands Yes PreDisposition Did not know what to do Care Advice Given Per Guideline HOME CARE: * You should be able to treat this at home. REASSURANCE AND EDUCATION - DIARRHEA: * Diarrhea may be caused by a virus ('stomach flu') or a bacteria. Diarrhea is one of the body's way of getting rid of germs. * Drink more fluids, at least 8 to 10 cups daily. One cup equals 8 oz (240 ml). * WATER: For mild to moderate diarrhea, water is often the best liquid to drink. You should also eat some salty foods (such as potato chips, pretzels, saltine crackers). This is important to make sure you are getting enough salt, sugars, and fluids to meet your body's needs. * AVOID carbonated soft drinks (soda) as these can make your diarrhea worse. * AVOID alcohol beverages (such as beer, wine, hard liquor). * AVOID caffeinated beverages. Reason: Caffeine is mildly dehydrating. FOOD AND NUTRITION DURING SEVERE DIARRHEA: * Drinking enough liquids is more important than eating when you have severe diarrhea. * This medicine helps decrease diarrhea. It is available over-the-counter (OTC) in a drugstore. DIARRHEA MEDICINE - LOPERAMIDE (IMODIUM AD): * Adult dosage: 4 mg (2 capsules) is the recommended first dose. You may take an additional 2 mg (1 capsule) after each loose stool. * Maximum dosage: 8 mg per day (4 capsules). * Do not use for more than 2 days. * Bismuth subsalicylate is available in other over-the-counter medicines (such as Kaopectate). Be certain  to follow the dosing instructions on the package as it varies by brand. CALL BACK IF: *  Signs of dehydration occur (such as no urine over 12 hours, very dry mouth, lightheaded) * Diarrhea lasts over 7 days * You become worse CARE ADVICE given per Diarrhea (Adult) guideline

## 2022-11-27 NOTE — Telephone Encounter (Signed)
LMOM informing Pt of recommendations. Instructed to call if questions/concerns.

## 2022-11-27 NOTE — Telephone Encounter (Signed)
Pt called after hours before we opened and explained to the nurse that she could not make the appt today. Pt said she is having diarrhea 2-3x every half hour right now and it's just liquid. When she woke up around 3 am it had some consistency to it. Pt's husband went to get her pepto bismol. Pt was offered to keep her appt but make it an acute appt. Pt said she lives in [place] and doesn't think she could make it here without having to go to the bathroom. Please call to advise what she can do.

## 2022-11-27 NOTE — Telephone Encounter (Signed)
Please advise 

## 2022-11-27 NOTE — Telephone Encounter (Signed)
Recommend home management for diarrhea: Push fluids, bland diet (soda crackers, soup, avoid heavy meals). OTCs such as Imodium or Pepto-Bismol. Needs to be seen if: Persistent symptoms, suspect dehydration, dizziness when standing up, low urine output.

## 2022-12-13 ENCOUNTER — Ambulatory Visit: Payer: BC Managed Care – PPO | Admitting: Internal Medicine

## 2022-12-13 ENCOUNTER — Encounter: Payer: Self-pay | Admitting: Internal Medicine

## 2022-12-13 VITALS — BP 130/68 | HR 73 | Temp 97.7°F | Resp 16 | Ht 64.0 in | Wt 209.1 lb

## 2022-12-13 DIAGNOSIS — E785 Hyperlipidemia, unspecified: Secondary | ICD-10-CM

## 2022-12-13 DIAGNOSIS — E1169 Type 2 diabetes mellitus with other specified complication: Secondary | ICD-10-CM | POA: Diagnosis not present

## 2022-12-13 DIAGNOSIS — J4520 Mild intermittent asthma, uncomplicated: Secondary | ICD-10-CM

## 2022-12-13 LAB — HEMOGLOBIN A1C: Hgb A1c MFr Bld: 6.4 % (ref 4.6–6.5)

## 2022-12-13 MED ORDER — ESOMEPRAZOLE MAGNESIUM 20 MG PO CPDR: 20.0000 mg | DELAYED_RELEASE_CAPSULE | Freq: Every day | ORAL | 1 refills | Status: DC

## 2022-12-13 MED ORDER — ATORVASTATIN CALCIUM 40 MG PO TABS: 40.0000 mg | ORAL_TABLET | Freq: Every day | ORAL | 1 refills | Status: DC

## 2022-12-13 NOTE — Progress Notes (Signed)
   Subjective:    Patient ID: Deborah Fuller, female    DOB: 1960/03/10, 62 y.o.   MRN: [id number]  DOS:  12/13/2022 Type of visit - description: f/u  Chronic medical problems addressed. Asthma is well-controlled. Denies lower extremity paresthesias  Review of Systems See above   Past Medical History:  Diagnosis Date   Allergies    Anxiety    Asthma    Diabetes (HCC)    Diverticulosis    cscope 03/2014   GERD (gastroesophageal reflux disease)    Hyperlipidemia    Vitamin D deficiency     Past Surgical History:  Procedure Laterality Date   BREAST BIOPSY Left 09/14/2005   CARPAL TUNNEL RELEASE Left 07/06/2022   CESAREAN SECTION  1994    Current Outpatient Medications  Medication Instructions   albuterol (PROVENTIL) (2.5 MG/3ML) 0.083% nebulizer solution 3 mLs, Inhalation, Every 8 hours PRN   albuterol (VENTOLIN HFA) 108 (90 Base) MCG/ACT inhaler 2 puffs, Inhalation, Every 4 hours PRN   atorvastatin (LIPITOR) 40 mg, Oral, Daily at bedtime   BREO ELLIPTA 100-25 MCG/ACT AEPB 1 puff, Inhalation, Daily   cholecalciferol (VITAMIN D3) 1,000 Units, Oral, Daily   cyanocobalamin (VITAMIN B12) 1,000 mcg, Oral, Daily   ECHINACEA PO 1 tablet, Oral, Daily   esomeprazole (NEXIUM) 20 mg, Oral, Daily   Multiple Vitamin (MULTIVITAMIN) tablet 1 tablet, Oral, Daily   ondansetron (ZOFRAN-ODT) 4 mg, Oral, Every 8 hours PRN   OVER THE COUNTER MEDICATION 1 capsule, Oral, Daily, tumeric   vitamin C 1,000 mg, Oral, Daily   zinc gluconate 50 mg, Oral, Daily       Objective:   Physical Exam BP 130/68   Pulse 73   Temp 97.7 F (36.5 C) (Oral)   Resp 16   Ht 5\' 4"  (1.626 m)   Wt 209 lb 2 oz (94.9 kg)   SpO2 98%   BMI 35.90 kg/m  General:   Well developed, NAD, BMI noted. HEENT:  Normocephalic . Face symmetric, atraumatic Lungs:  CTA B Normal respiratory effort, no intercostal retractions, no accessory muscle use. Heart: RRR,  no murmur.  DM foot exam: No edema, good pedal  pulses, pinprick examination normal. Skin: Not pale. Not jaundice Neurologic:  alert & oriented X3.  Speech normal, gait appropriate for age and unassisted Psych--  Cognition and judgment appear intact.  Cooperative with normal attention span and concentration.  Behavior appropriate. No anxious or depressed appearing.      Assessment     Assessment (new patient 01/2021, referred by her husband) Diabetes dx 02-2020  Hyperlipidemia Asthma onset 2018 , never smoke, ER visit x 1 12-2020  GERD (EGD 04/02/2014, normal, BX normal). Vit d  Menopausal 62 y/o  PLAN DM: Diet controlled, check A1c.  Feet exam negative. Hyperlipidemia: Controlled on atorvastatin, RF sent. Asthma: Under excellent control on Breo.  No change. GERD: Refilled Nexium. Vaccine advise: Recommend flu and COVID vaccines, patient reluctant, benefits discussed. RTC 02-2023 CPX

## 2022-12-13 NOTE — Assessment & Plan Note (Signed)
DM: Diet controlled, check A1c.  Feet exam negative. Hyperlipidemia: Controlled on atorvastatin, RF sent. Asthma: Under excellent control on Breo.  No change. GERD: Refilled Nexium. Vaccine advise: Recommend flu and COVID vaccines, patient reluctant, benefits discussed. RTC 02-2023 CPX

## 2022-12-13 NOTE — Patient Instructions (Addendum)
Vaccines I recommend: Covid booster RSV vaccine Flu shot    GO TO THE LAB : Get the blood work     Next visit with me by January 2025, physical exam  please schedule it at the front desk      Per our records you are due for your diabetic eye exam. Please contact your eye doctor to schedule an appointment. Please have them send copies of your office visit notes to [place]. Our fax number is [phone]. If you need a referral to an eye doctor please let us know.

## 2022-12-25 ENCOUNTER — Ambulatory Visit
Admission: RE | Admit: 2022-12-25 | Discharge: 2022-12-25 | Disposition: A | Discharge status: Home / Self Care | Payer: BC Managed Care – PPO | Source: Ambulatory Visit | Attending: Internal Medicine | Admitting: Internal Medicine

## 2022-12-25 DIAGNOSIS — Z1231 Encounter for screening mammogram for malignant neoplasm of breast: Secondary | ICD-10-CM

## 2023-03-12 ENCOUNTER — Encounter: Payer: 59 | Admitting: Internal Medicine

## 2023-04-16 ENCOUNTER — Encounter: Payer: 59 | Admitting: Internal Medicine

## 2023-04-30 ENCOUNTER — Ambulatory Visit (INDEPENDENT_AMBULATORY_CARE_PROVIDER_SITE_OTHER): Payer: 59 | Admitting: Internal Medicine

## 2023-04-30 ENCOUNTER — Encounter: Payer: Self-pay | Admitting: Internal Medicine

## 2023-04-30 VITALS — BP 126/82 | HR 66 | Temp 97.8°F | Resp 16 | Ht 64.0 in | Wt 206.1 lb

## 2023-04-30 DIAGNOSIS — J4520 Mild intermittent asthma, uncomplicated: Secondary | ICD-10-CM | POA: Diagnosis not present

## 2023-04-30 DIAGNOSIS — E1169 Type 2 diabetes mellitus with other specified complication: Secondary | ICD-10-CM | POA: Diagnosis not present

## 2023-04-30 DIAGNOSIS — E785 Hyperlipidemia, unspecified: Secondary | ICD-10-CM

## 2023-04-30 DIAGNOSIS — K219 Gastro-esophageal reflux disease without esophagitis: Secondary | ICD-10-CM | POA: Insufficient documentation

## 2023-04-30 DIAGNOSIS — Z0001 Encounter for general adult medical examination with abnormal findings: Secondary | ICD-10-CM

## 2023-04-30 DIAGNOSIS — Z0000 Encounter for general adult medical examination without abnormal findings: Secondary | ICD-10-CM

## 2023-04-30 LAB — CBC WITH DIFFERENTIAL/PLATELET
Basophils Absolute: 0.1 10*3/uL (ref 0.0–0.1)
Basophils Relative: 1.5 % (ref 0.0–3.0)
Eosinophils Absolute: 0.1 10*3/uL (ref 0.0–0.7)
Eosinophils Relative: 3 % (ref 0.0–5.0)
HCT: 38.3 % (ref 36.0–46.0)
Hemoglobin: 12.5 g/dL (ref 12.0–15.0)
Lymphocytes Relative: 47.4 % — ABNORMAL HIGH (ref 12.0–46.0)
Lymphs Abs: 1.6 10*3/uL (ref 0.7–4.0)
MCHC: 32.7 g/dL (ref 30.0–36.0)
MCV: 90.9 fl (ref 78.0–100.0)
Monocytes Absolute: 0.4 10*3/uL (ref 0.1–1.0)
Monocytes Relative: 11.4 % (ref 3.0–12.0)
Neutro Abs: 1.3 10*3/uL — ABNORMAL LOW (ref 1.4–7.7)
Neutrophils Relative %: 36.7 % — ABNORMAL LOW (ref 43.0–77.0)
Platelets: 261 10*3/uL (ref 150.0–400.0)
RBC: 4.21 Mil/uL (ref 3.87–5.11)
RDW: 14.6 % (ref 11.5–15.5)
WBC: 3.4 10*3/uL — ABNORMAL LOW (ref 4.0–10.5)

## 2023-04-30 LAB — COMPREHENSIVE METABOLIC PANEL
ALT: 17 U/L (ref 0–35)
AST: 21 U/L (ref 0–37)
Albumin: 4 g/dL (ref 3.5–5.2)
Alkaline Phosphatase: 75 U/L (ref 39–117)
BUN: 14 mg/dL (ref 6–23)
CO2: 29 meq/L (ref 19–32)
Calcium: 9.5 mg/dL (ref 8.4–10.5)
Chloride: 107 meq/L (ref 96–112)
Creatinine, Ser: 0.87 mg/dL (ref 0.40–1.20)
GFR: 70.64 mL/min (ref >60.00)
Glucose, Bld: 102 mg/dL — ABNORMAL HIGH (ref 70–99)
Potassium: 4.6 meq/L (ref 3.5–5.1)
Sodium: 142 meq/L (ref 135–145)
Total Bilirubin: 0.5 mg/dL (ref 0.2–1.2)
Total Protein: 6.8 g/dL (ref 6.0–8.3)

## 2023-04-30 LAB — HEMOGLOBIN A1C: Hgb A1c MFr Bld: 6.5 % (ref 4.6–6.5)

## 2023-04-30 LAB — LIPID PANEL
Cholesterol: 141 mg/dL (ref 0–200)
HDL: 61.5 mg/dL (ref >39.00)
LDL Cholesterol: 70 mg/dL (ref 0–99)
NonHDL: 79.49
Total CHOL/HDL Ratio: 2
Triglycerides: 49 mg/dL (ref 0.0–149.0)
VLDL: 9.8 mg/dL (ref 0.0–40.0)

## 2023-04-30 LAB — MICROALBUMIN / CREATININE URINE RATIO
Creatinine,U: 193.2 mg/dL
Microalb Creat Ratio: 10.5 mg/g (ref 0.0–30.0)
Microalb, Ur: 2 mg/dL — ABNORMAL HIGH (ref 0.0–1.9)

## 2023-04-30 NOTE — Assessment & Plan Note (Signed)
 Here for CPX  We also discussed the following DM: Diet controlled, checking A1c and micro Hyperlipidemia: On atorvastatin, checking labs Asthma: Micah Flesher to the ER April 2024, we talk about the importance of prevention, currently not taking Breo daily. Plan: Recommend Breo daily, if she is not inclined to do so, at the very least start Breo before the spring and before the fall which are the times that her asthma is typically exacerbated.  Also recommend appropriate vaccines. GERD: Well-controlled Vitamin D: Last level satisfactory RTC 6 months

## 2023-04-30 NOTE — Assessment & Plan Note (Signed)
 Here for CPX - Tdap 2016 -PNM 20: December 2022 -S/p 2 shingrix:   Vaccines are recommended: Flu shot every fall, COVID booster if not done recently, RSV  due to history of asthma - CCS: Colonoscopy 04/02/2014: Normal, 10 years. Also NEG Cologuard  04/05/2020. Options d/w pt , elected Cscope 2026. - Female care: PAP 06-2021 ([facility]), saw gyn 06/2022  +FH breast Ca (M)  MMG 12/25/2022 -Diet exercise discussed -Labs: CMP FLP CBC A1c micro -Healthcare POA: See AVS

## 2023-04-30 NOTE — Progress Notes (Signed)
 Subjective:    Patient ID: Deborah Fuller, female    DOB: May 26, 1960, 63 y.o.   MRN: [id number]  DOS:  04/30/2023 Type of visit - description: CPX  Here for CPX. Feeling well other than some arthritis pain in the upper extremities.   Review of Systems  Other than above, a 14 point review of systems is negative       Past Medical History:  Diagnosis Date   Allergies    Anxiety    Asthma    Diabetes (HCC)    Diverticulosis    cscope 03/2014   GERD (gastroesophageal reflux disease)    Hyperlipidemia    Vitamin D deficiency     Past Surgical History:  Procedure Laterality Date   BREAST BIOPSY Left 09/14/2005   CARPAL TUNNEL RELEASE Left 07/06/2022   CESAREAN SECTION  1994   Social History   Socioeconomic History   Marital status: Married    Spouse name: Not on file   Number of children: 4   Years of education: Not on file   Highest education level: Associate degree: occupational, Scientist, product/process development, or vocational program  Occupational History   Occupation: Geologist, engineering , 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  Tobacco Use   Smoking status: Never   Smokeless tobacco: Never  Substance and Sexual Activity   Alcohol use: Yes    Comment: rarely   Drug use: Not on file   Sexual activity: Not on file  Other Topics Concern   Not on file  Social History Narrative   Household: pt, husband and youngest son   Social Drivers of Corporate investment banker Strain: Low Risk  (04/23/2023)   Overall Financial Resource Strain (CARDIA)    Difficulty of Paying Living Expenses: Not very hard  Food Insecurity: Food Insecurity Present (04/23/2023)   Hunger Vital Sign    Worried About Running Out of Food in the Last Year: Never true    Ran Out of Food in the Last Year: Sometimes true  Transportation Needs: No Transportation Needs (04/23/2023)   PRAPARE - Administrator, Civil Service (Medical): No    Lack of Transportation (Non-Medical): No  Physical Activity: Insufficiently Active  (04/23/2023)   Exercise Vital Sign    Days of Exercise per Week: 2 days    Minutes of Exercise per Session: 30 min  Stress: No Stress Concern Present (04/23/2023)   [employer] of Occupational Health - Occupational Stress Questionnaire    Feeling of Stress : Only a little  Social Connections: Unknown (04/23/2023)   Social Connection and Isolation Panel [NHANES]    Frequency of Communication with Friends and Family: More than three times a week    Frequency of Social Gatherings with Friends and Family: More than three times a week    Attends Religious Services: More than 4 times per year    Active Member of [employer] or Organizations: Patient declined    Attends Engineer, structural: Not on file    Marital Status: Married  Catering manager Violence: Not on file     Current Outpatient Medications  Medication Instructions   albuterol (PROVENTIL) (2.5 MG/3ML) 0.083% nebulizer solution 3 mLs, Inhalation, Every 8 hours PRN   albuterol (VENTOLIN HFA) 108 (90 Base) MCG/ACT inhaler 2 puffs, Inhalation, Every 4 hours PRN   atorvastatin (LIPITOR) 40 mg, Oral, Daily at bedtime   BREO ELLIPTA 100-25 MCG/ACT AEPB 1 puff, Inhalation, Daily   cholecalciferol (VITAMIN D3) 1,000 Units, Daily   cyanocobalamin (VITAMIN B12)  1,000 mcg, Daily   ECHINACEA PO 1 tablet, Daily   esomeprazole (NEXIUM) 20 mg, Oral, Daily before breakfast   Multiple Vitamin (MULTIVITAMIN) tablet 1 tablet, Daily   OVER THE COUNTER MEDICATION 1 capsule, Daily   vitamin C 1,000 mg, Daily   zinc gluconate 50 mg, Daily       Objective:   Physical Exam BP 126/82   Pulse 66   Temp 97.8 F (36.6 C) (Oral)   Resp 16   Ht 5\' 4"  (1.626 m)   Wt 206 lb 2 oz (93.5 kg)   SpO2 95%   BMI 35.38 kg/m  General: Well developed, NAD, BMI noted Neck: No  thyromegaly  HEENT:  Normocephalic . Face symmetric, atraumatic Lungs:  CTA B Normal respiratory effort, no intercostal retractions, no accessory muscle use. Heart: RRR,   no murmur.  Abdomen:  Not distended, soft, non-tender. No rebound or rigidity.   Lower extremities: no pretibial edema bilaterally  Skin: Exposed areas without rash. Not pale. Not jaundice Neurologic:  alert & oriented X3.  Speech normal, gait appropriate for age and unassisted Strength symmetric and appropriate for age.  Psych: Cognition and judgment appear intact.  Cooperative with normal attention span and concentration.  Behavior appropriate. No anxious or depressed appearing.     Assessment    Assessment (new patient 01/2021, referred by her husband) Diabetes dx 02-2020  Hyperlipidemia Asthma onset 2018 , never smoke, ER visits-- 12-2020, 06/20/2022 GERD (EGD 04/02/2014, normal, BX normal). Vit d  Menopausal 63 y/o  PLAN Here for CPX - Tdap 2016 -PNM 20: December 2022 -S/p 2 shingrix:   Vaccines are recommended: Flu shot every fall, COVID booster if not done recently, RSV  due to history of asthma - CCS: Colonoscopy 04/02/2014: Normal, 10 years. Also NEG Cologuard  04/05/2020. Options d/w pt , elected Cscope 2026. - Female care: PAP 06-2021 ([facility]), saw gyn 06/2022  +FH breast Ca (M)  MMG 12/25/2022 -Diet exercise discussed -Labs: CMP FLP CBC A1c micro -Healthcare POA: See AVS   We also discussed the following DM: Diet controlled, checking A1c and micro Hyperlipidemia: On atorvastatin, checking labs Asthma: Micah Flesher to the ER April 2024, we talk about the importance of prevention, currently not taking Breo daily. Plan: Recommend Breo daily, if she is not inclined to do so, at the very least start Breo before the spring and before the fall which are the times that her asthma is typically exacerbated.  Also recommend appropriate vaccines. GERD: Well-controlled Vitamin D: Last level satisfactory RTC 6 months

## 2023-04-30 NOTE — Patient Instructions (Addendum)
 Vaccines I recommend: RSV, you can get it at the pharmacy COVID booster if not done in the last 6 months Flu shot every fall  Take Breo daily. Albuterol as needed only.   GO TO THE LAB : Get the blood work     Please go to the front desk: Arrange for a office visit in 6 months   "Health Care Power of attorney" (Also know as a  "Living will" or  Advance care planning documents)  If you already have a living will or healthcare power of attorney, is recommended you bring the copy to be scanned in your chart.   The document will be available to all the doctors you see in the system.  If you are over 1 y/o and don't have the document, please read:  Advance care planning is a process that supports adults in  understanding and sharing their preferences regarding future medical care.  The patient's preferences are recorded in documents called Advance Directives and the can be modified at any time while the patient is in full mental capacity.     More information at: [email or web address]    Per our records you are due for your diabetic eye exam. Please contact your eye doctor to schedule an appointment. Please have them send copies of your office visit notes to [place]. Our fax number is [phone]. If you need a referral to an eye doctor please let us know.

## 2023-05-02 ENCOUNTER — Encounter: Payer: Self-pay | Admitting: Internal Medicine

## 2023-05-13 ENCOUNTER — Other Ambulatory Visit: Payer: Self-pay | Admitting: Internal Medicine

## 2023-05-14 ENCOUNTER — Ambulatory Visit: Payer: Self-pay | Admitting: Internal Medicine

## 2023-05-14 NOTE — Telephone Encounter (Signed)
 Copied from CRM [id number]. Topic: Clinical - Red Word Triage >> May 14, 2023  9:49 AM Gurney Maxin H wrote: [facility] that prompted transfer to Nurse Triage: Scratchy throat, slight start tightness in chest, breathing not normal.   Chief Complaint: fatigue, wanting Nebulizer solution called in (advised pt this has already been completed) Symptoms: feeling like she is coughing more, runny nose, watery eyes, wanting RX Frequency: last week Pertinent Negatives: Patient denies wheezing  Disposition: [] ED /[] Urgent Care (no appt availability in office) / [x] Appointment(In office/virtual)/ []  [facility]/ [] Home Care/ [] Refused Recommended Disposition /[] [facility] [place] [facility]/ []  Follow-up with PCP Additional Notes: pt agreed to schedule appt but wanted Friday appt. Pt advised to call back of she notes any worsening in her sx (increased SOB, needeing neb more often, or worsening)  Reason for Disposition  Uses more than 1 quick-relief inhaler (e.g., albuterol, salbutamol) per month  Answer Assessment - Initial Assessment Questions 1. RESPIRATORY STATUS: "Describe your breathing?" (e.g., wheezing, shortness of breath, unable to speak, severe coughing)      Feels that she is tired and like she is on the road attack  2. ONSET: "When did this asthma attack begin?"      No attack  3. TRIGGER: "What do you think triggered this attack?" (e.g., URI, exposure to pollen or other allergen, tobacco smoke)      Pollen  5. SEVERITY: "How bad is this attack?"    - MILD: No SOB at rest, mild SOB with walking, speaks normally in sentences, can lie down, no retractions, pulse < 100. (GREEN Zone: PEFR 80-100%)   - MODERATE: SOB at rest, SOB with minimal exertion and prefers to sit, cannot lie down flat, speaks in phrases, mild retractions, audible wheezing, pulse 100-120. (YELLOW Zone: PEFR 50-79%)    - SEVERE: Struggling for each breath, speaks in single words, struggling to breathe, sitting hunched forward,  retractions, usually loud wheezing, sometimes minimal wheezing because of decreased air movement, pulse > 120. (RED Zone: PEFR < 50%).      Mild  6. ASTHMA MEDICINES:  "What treatments have you tried?"    - INHALED QUICK RELIEF (RESCUE): "What is your inhaled quick-relief medicine?" (e.g., albuterol, salbutamol) "Do you use an inhaler or a nebulizer?" "How frequently have you been using this medicine?"   - CONTROLLER (LONG-TERM-CONTROL): "Do you take an inhaled steroid? (e.g., Asmanex, Flovent, Pulmicort, Qvar)     Albuterol - Breo  7. INHALED QUICK-RELIEF TREATMENTS FOR THIS ATTACK: "What treatments have you given yourself so far?" and "How many and how often?" If using an inhaler, ask, "How many puffs?" Note: Routine treatments are 2 puffs every 4 hours as needed. Rescue treatments are 4 puffs repeated every 20 minutes, up to three times as needed.      Albuterol nebulizer from  8. OTHER SYMPTOMS: "Do you have any other symptoms? (e.g., chest pain, coughing up yellow sputum, fever, runny nose)     Tired  Protocols used: Asthma Attack-A-AH

## 2023-05-16 ENCOUNTER — Other Ambulatory Visit: Payer: Self-pay | Admitting: Internal Medicine

## 2023-05-18 ENCOUNTER — Telehealth (INDEPENDENT_AMBULATORY_CARE_PROVIDER_SITE_OTHER): Admitting: Internal Medicine

## 2023-05-18 ENCOUNTER — Encounter: Payer: Self-pay | Admitting: Internal Medicine

## 2023-05-18 VITALS — Ht 64.0 in | Wt 206.0 lb

## 2023-05-18 DIAGNOSIS — R051 Acute cough: Secondary | ICD-10-CM

## 2023-05-18 DIAGNOSIS — J4521 Mild intermittent asthma with (acute) exacerbation: Secondary | ICD-10-CM | POA: Diagnosis not present

## 2023-05-18 DIAGNOSIS — Z20822 Contact with and (suspected) exposure to covid-19: Secondary | ICD-10-CM | POA: Diagnosis not present

## 2023-05-18 MED ORDER — BREO ELLIPTA 100-25 MCG/ACT IN AEPB: 1.0000 | INHALATION_SPRAY | Freq: Every day | RESPIRATORY_TRACT | 5 refills | Status: AC

## 2023-05-18 MED ORDER — BENZONATATE 200 MG PO CAPS: 200.0000 mg | ORAL_CAPSULE | Freq: Three times a day (TID) | ORAL | 0 refills | Status: DC | PRN

## 2023-05-18 NOTE — Progress Notes (Signed)
 Subjective:    Patient ID: Deborah Fuller, female    DOB: 08/06/1960, 63 y.o.   MRN: [id number]  DOS:  05/18/2023 Type of visit - description: Virtual Visit via Video Note  I connected with the above patient  by a video enabled telemedicine application and verified that I am speaking with the correct person using two identifiers.   Location of patient: home  Location of provider: office  Persons participating in the virtual visit: patient, provider   I discussed the limitations of evaluation and management by telemedicine and the availability of in person appointments. The patient expressed understanding and agreed to proceed.  Acute Symptoms started 2 days ago:  persistent dry cough. Denies shortness of breath, fever or chills. Occasional nausea and vomited a couple of times mostly from cough. Denies myalgias or GERD. She has asthma, has used her inhalers twice a day with some relief of the cough.  Husband was diagnosed with COVID yesterday, patient tested negative.    Review of Systems See above   Past Medical History:  Diagnosis Date   Allergies    Anxiety    Asthma    Diabetes (HCC)    Diverticulosis    cscope 03/2014   GERD (gastroesophageal reflux disease)    Hyperlipidemia    Vitamin D deficiency     Past Surgical History:  Procedure Laterality Date   BREAST BIOPSY Left 09/14/2005   CARPAL TUNNEL RELEASE Left 07/06/2022   CESAREAN SECTION  1994    Current Outpatient Medications  Medication Instructions   albuterol (PROVENTIL) (2.5 MG/3ML) 0.083% nebulizer solution 3 mLs, Inhalation, Every 8 hours PRN   albuterol (VENTOLIN HFA) 108 (90 Base) MCG/ACT inhaler 2 puffs, Inhalation, Every 4 hours PRN   atorvastatin (LIPITOR) 40 mg, Oral, Daily at bedtime   BREO ELLIPTA 100-25 MCG/ACT AEPB 1 puff, Inhalation, Daily   cholecalciferol (VITAMIN D3) 1,000 Units, Daily   cyanocobalamin (VITAMIN B12) 1,000 mcg, Daily   ECHINACEA PO 1 tablet, Daily    esomeprazole (NEXIUM) 20 mg, Oral, Daily before breakfast   Multiple Vitamin (MULTIVITAMIN) tablet 1 tablet, Daily   OVER THE COUNTER MEDICATION 1 capsule, Daily   vitamin C 1,000 mg, Daily   zinc gluconate 50 mg, Daily       Objective:   Physical Exam Ht 5\' 4"  (1.626 m)   Wt 206 lb (93.4 kg)   BMI 35.36 kg/m  This is a virtual video visit, she has no vital signs or O2 sats available.  Mild occasional cough noted, speaking in complete sentences, nontoxic-appearing, no respiratory distress.     Assessment     Assessment (new patient 01/2021, referred by her husband) Diabetes dx 02-2020  Hyperlipidemia Asthma onset 2018 , never smoke, ER visits-- 12-2020, 06/20/2022 GERD (EGD 04/02/2014, normal, BX normal). Vit d  Menopausal 63 y/o  PLAN Cough: Symptoms started 2 days ago, husband tested positive for COVID yesterday but patient tested negative. She has a history of asthma, her cough decreases with albuterol. Prednisone is listed on her allergies/contraindications. Plan: Continue Breo, RF sent, use albuterol every 6 hours as needed, continue OTC guaifenesin/dextromethorphan/Tylenol. Add Lawyer. Recommend to check for COVID tomorrow and the next day.  If positive she will let us know, she will qualify for antivirals if COVID is documented. Sent a detailed message with instructions.    I discussed the assessment and treatment plan with the patient. The patient was provided an opportunity to ask questions and all were answered. The patient  agreed with the plan and demonstrated an understanding of the instructions.   The patient was advised to call back or seek an in-person evaluation if the symptoms worsen or if the condition fails to improve as anticipated.

## 2023-05-18 NOTE — Assessment & Plan Note (Signed)
 Cough: Symptoms started 2 days ago, husband tested positive for COVID yesterday but patient tested negative. She has a history of asthma, her cough decreases with albuterol. Prednisone is listed on her allergies/contraindications. Plan: Continue Breo, RF sent, use albuterol every 6 hours as needed, continue OTC guaifenesin/dextromethorphan/Tylenol. Add Lawyer. Recommend to check for COVID tomorrow and the next day.  If positive she will let us know, she will qualify for antivirals if COVID is documented. Sent a detailed message with instructions.

## 2023-11-05 ENCOUNTER — Ambulatory Visit: Admitting: Internal Medicine

## 2023-11-08 ENCOUNTER — Other Ambulatory Visit: Payer: Self-pay | Admitting: Internal Medicine

## 2023-11-28 ENCOUNTER — Other Ambulatory Visit: Payer: Self-pay | Admitting: Internal Medicine

## 2023-11-28 DIAGNOSIS — Z1231 Encounter for screening mammogram for malignant neoplasm of breast: Secondary | ICD-10-CM

## 2023-11-30 ENCOUNTER — Encounter: Payer: Self-pay | Admitting: Internal Medicine

## 2023-11-30 ENCOUNTER — Ambulatory Visit: Admitting: Internal Medicine

## 2023-11-30 VITALS — BP 122/68 | HR 61 | Temp 97.6°F | Resp 16 | Ht 64.0 in | Wt 204.0 lb

## 2023-11-30 DIAGNOSIS — E119 Type 2 diabetes mellitus without complications: Secondary | ICD-10-CM

## 2023-11-30 DIAGNOSIS — Z23 Encounter for immunization: Secondary | ICD-10-CM

## 2023-11-30 LAB — MICROALBUMIN / CREATININE URINE RATIO
Creatinine,U: 164.7 mg/dL
Microalb Creat Ratio: 25.3 mg/g (ref 0.0–30.0)
Microalb, Ur: 4.2 mg/dL — ABNORMAL HIGH (ref 0.0–1.9)

## 2023-11-30 LAB — HEMOGLOBIN A1C: Hgb A1c MFr Bld: 6.6 % — ABNORMAL HIGH (ref 4.6–6.5)

## 2023-11-30 NOTE — Progress Notes (Signed)
 Subjective:    Patient ID: Deborah Fuller, female    DOB: 1960/08/15, 63 y.o.   MRN: [id number]  DOS:  11/30/2023 Follow-up Discussed the use of AI scribe software for clinical note transcription with the patient, who gave verbal consent to proceed.  History of Present Illness Hypertension - Blood pressure is well-controlled.  Asthma - Managed with Breo Ellipta . - Infrequent use of albuterol , with no use in the past three to four months.  Diabetes mellitus - Practices diligent foot care. - No numbness or tingling in feet. - Avoids walking barefoot.  Weight management - Focuses on portion control and increasing vegetable intake. - Recent weight loss.  Immunization status - Considering COVID vaccine. - Received influenza vaccination.    Wt Readings from Last 3 Encounters:  11/30/23 204 lb (92.5 kg)  05/18/23 206 lb (93.4 kg)  04/30/23 206 lb 2 oz (93.5 kg)     Review of Systems See above   Past Medical History:  Diagnosis Date   Allergies    Anxiety    Asthma    Diabetes (HCC)    Diverticulosis    cscope 03/2014   GERD (gastroesophageal reflux disease)    Hyperlipidemia    Vitamin D deficiency     Past Surgical History:  Procedure Laterality Date   BREAST BIOPSY Left 09/14/2005   CARPAL TUNNEL RELEASE Left 07/06/2022   CESAREAN SECTION  1994    Current Outpatient Medications  Medication Instructions   albuterol  (PROVENTIL ) (2.5 MG/3ML) 0.083% nebulizer solution 3 mLs, Inhalation, Every 8 hours PRN   albuterol  (VENTOLIN  HFA) 108 (90 Base) MCG/ACT inhaler 2 puffs, Inhalation, Every 4 hours PRN   atorvastatin  (LIPITOR) 40 mg, Oral, Daily   benzonatate  (TESSALON ) 200 mg, Oral, 3 times daily PRN   BREO ELLIPTA  100-25 MCG/ACT AEPB 1 puff, Inhalation, Daily   cholecalciferol (VITAMIN D3) 1,000 Units, Daily   cyanocobalamin (VITAMIN B12) 1,000 mcg, Daily   ECHINACEA PO 1 tablet, Daily   esomeprazole  (NEXIUM ) 20 mg, Oral, Daily at bedtime   Multiple  Vitamin (MULTIVITAMIN) tablet 1 tablet, Daily   OVER THE COUNTER MEDICATION 1 capsule, Daily   vitamin C 1,000 mg, Daily   zinc gluconate 50 mg, Daily       Objective:   Physical Exam BP 122/68   Pulse 61   Temp 97.6 F (36.4 C) (Oral)   Resp 16   Ht 5' 4 (1.626 m)   Wt 204 lb (92.5 kg)   SpO2 97%   BMI 35.02 kg/m  General:   Well developed, NAD, BMI noted. HEENT:  Normocephalic . Face symmetric, atraumatic Lungs:  CTA B Normal respiratory effort, no intercostal retractions, no accessory muscle use. Heart: RRR,  no murmur.  DM foot exam: No edema, good pedal pulses, pinprick examination normal.  He has a couple calluses at the fifth toes  without erythema Skin: Not pale. Not jaundice Neurologic:  alert & oriented X3.  Speech normal, gait appropriate for age and unassisted Psych--  Cognition and judgment appear intact.  Cooperative with normal attention span and concentration.  Behavior appropriate. No anxious or depressed appearing.      Assessment    Assessment (new patient 01/2021, referred by her husband) Diabetes dx 02-2020  Hyperlipidemia Asthma onset 2018 , never smoke, ER visits-- 12-2020, 06/20/2022 GERD (EGD 04/02/2014, normal, BX normal). Vit d  Menopausal 63 y/o  Assessment & Plan DM: Diet controlled. No neuropathy or complications. Normal creatinine and LDL 70.  Check A1c and  micro.  Feet care discussed Asthma: Well-controlled with Breo Ellipta  and infrequent rescue inhaler use. No recent exacerbations. General Health Maintenance Discussed lifestyle modifications and vaccinations. She is considering COVID vaccination with her family. - Encourage gradual increase in physical activity, starting with 15-minute walks and increasing to 30 minutes. RTC CPX 04/2024

## 2023-11-30 NOTE — Patient Instructions (Signed)
 GO TO THE LAB :  Get the blood work    Then, go to the front desk for the checkout Please make an appointment for a physical exam by March 2026  Please read more detailed instructions below    TYPE 2 DIABETES MELLITUS: Your diabetes is being monitored, and there are no signs of complications. Your recent tests show normal creatinine and LDL levels. -Check your A1c and microalbumin levels. -Continue to monitor your feet for any redness or swelling and avoid walking barefoot. -Gradually increase your physical activity, starting with 15-minute walks and working up to 30 minutes.  ASTHMA: Your asthma is well-controlled with your current medication, and you have not had any recent exacerbations. -Continue using Breo Ellipta  as prescribed. -Use your rescue inhaler as needed, although it seems you haven't needed it recently.  You are considering the COVID vaccine with your family.

## 2023-12-01 ENCOUNTER — Ambulatory Visit: Payer: Self-pay | Admitting: Internal Medicine

## 2023-12-01 NOTE — Assessment & Plan Note (Signed)
 DM: Diet controlled. No neuropathy or complications. Normal creatinine and LDL 70.  Check A1c and micro.  Feet care discussed Asthma: Well-controlled with Breo Ellipta  and infrequent rescue inhaler use. No recent exacerbations. General Health Maintenance Discussed lifestyle modifications and vaccinations. She is considering COVID vaccination with her family. - Encourage gradual increase in physical activity, starting with 15-minute walks and increasing to 30 minutes. RTC CPX 04/2024

## 2023-12-26 ENCOUNTER — Ambulatory Visit
Admission: RE | Admit: 2023-12-26 | Discharge: 2023-12-26 | Disposition: A | Source: Ambulatory Visit | Attending: Internal Medicine | Admitting: Internal Medicine

## 2023-12-26 DIAGNOSIS — Z1231 Encounter for screening mammogram for malignant neoplasm of breast: Secondary | ICD-10-CM

## 2024-02-08 ENCOUNTER — Telehealth: Payer: Self-pay

## 2024-02-08 NOTE — Telephone Encounter (Signed)
 Spoke w/ Pt- informed we can send in generic breo once new insurance takes effect. Pt verbalized understanding.

## 2024-02-08 NOTE — Telephone Encounter (Signed)
 Copied from CRM #[id number]. Topic: Clinical - Medication Question >> Feb 08, 2024  1:39 PM Mercedes MATSU wrote: Reason for CRM: Patient called in stating that she will be changing insurances in the new years, and that when she does her BREO ELLIPTA  100-25 MCG/ACT AEPB inhaler would be more expensive. She is asking if Dr. Amon would be willing to write her a prescription for the generic version of the inhaler. Patient is requesting a call back and can be reached at [phone], today before 5pm if possible.

## 2024-05-02 ENCOUNTER — Encounter: Admitting: Internal Medicine
